# Patient Record
Sex: Male | Born: 1960 | Race: White | Hispanic: No | Marital: Married | Smoking: Never smoker
Health system: Southern US, Community
[De-identification: ages and names within clinical notes are randomized; demographics above are authoritative.]

## PROBLEM LIST (undated history)

## (undated) DIAGNOSIS — R7989 Other specified abnormal findings of blood chemistry: Secondary | ICD-10-CM

## (undated) DIAGNOSIS — I10 Essential (primary) hypertension: Secondary | ICD-10-CM

## (undated) HISTORY — DX: Other specified abnormal findings of blood chemistry: R79.89

## (undated) HISTORY — DX: Essential (primary) hypertension: I10

---

## 2007-05-06 ENCOUNTER — Emergency Department: Payer: Self-pay | Admitting: Emergency Medicine

## 2014-10-03 ENCOUNTER — Ambulatory Visit: Payer: Self-pay | Admitting: Gastroenterology

## 2016-12-05 DIAGNOSIS — M25519 Pain in unspecified shoulder: Secondary | ICD-10-CM | POA: Insufficient documentation

## 2018-10-05 ENCOUNTER — Inpatient Hospital Stay: Payer: Self-pay | Admitting: Oncology

## 2018-10-14 ENCOUNTER — Inpatient Hospital Stay: Payer: Medicare Other

## 2018-10-14 ENCOUNTER — Encounter (INDEPENDENT_AMBULATORY_CARE_PROVIDER_SITE_OTHER): Payer: Self-pay

## 2018-10-14 ENCOUNTER — Encounter: Payer: Self-pay | Admitting: Oncology

## 2018-10-14 ENCOUNTER — Other Ambulatory Visit: Payer: Self-pay

## 2018-10-14 ENCOUNTER — Inpatient Hospital Stay: Payer: Medicare Other | Attending: Oncology | Admitting: Oncology

## 2018-10-14 VITALS — BP 105/71 | HR 77 | Temp 96.7°F | Resp 18 | Ht 71.0 in | Wt 222.3 lb

## 2018-10-14 DIAGNOSIS — R7401 Elevation of levels of liver transaminase levels: Secondary | ICD-10-CM

## 2018-10-14 DIAGNOSIS — Z7982 Long term (current) use of aspirin: Secondary | ICD-10-CM | POA: Insufficient documentation

## 2018-10-14 DIAGNOSIS — D751 Secondary polycythemia: Secondary | ICD-10-CM | POA: Diagnosis present

## 2018-10-14 DIAGNOSIS — I10 Essential (primary) hypertension: Secondary | ICD-10-CM | POA: Insufficient documentation

## 2018-10-14 DIAGNOSIS — Z79899 Other long term (current) drug therapy: Secondary | ICD-10-CM | POA: Diagnosis not present

## 2018-10-14 DIAGNOSIS — R740 Nonspecific elevation of levels of transaminase and lactic acid dehydrogenase [LDH]: Secondary | ICD-10-CM | POA: Diagnosis not present

## 2018-10-14 LAB — CBC WITH DIFFERENTIAL/PLATELET
Abs Immature Granulocytes: 0.03 10*3/uL (ref 0.00–0.07)
Basophils Absolute: 0 10*3/uL (ref 0.0–0.1)
Basophils Relative: 0 %
Eosinophils Absolute: 0 10*3/uL (ref 0.0–0.5)
Eosinophils Relative: 1 %
HCT: 49.8 % (ref 39.0–52.0)
Hemoglobin: 17 g/dL (ref 13.0–17.0)
Immature Granulocytes: 0 %
LYMPHS ABS: 1.7 10*3/uL (ref 0.7–4.0)
Lymphocytes Relative: 23 %
MCH: 33.1 pg (ref 26.0–34.0)
MCHC: 34.1 g/dL (ref 30.0–36.0)
MCV: 96.9 fL (ref 80.0–100.0)
Monocytes Absolute: 0.8 10*3/uL (ref 0.1–1.0)
Monocytes Relative: 11 %
NEUTROS ABS: 4.8 10*3/uL (ref 1.7–7.7)
Neutrophils Relative %: 65 %
Platelets: 185 10*3/uL (ref 150–400)
RBC: 5.14 MIL/uL (ref 4.22–5.81)
RDW: 13.2 % (ref 11.5–15.5)
WBC: 7.4 10*3/uL (ref 4.0–10.5)
nRBC: 0 % (ref 0.0–0.2)

## 2018-10-14 LAB — COMPREHENSIVE METABOLIC PANEL
ALT: 104 U/L — ABNORMAL HIGH (ref 0–44)
AST: 64 U/L — ABNORMAL HIGH (ref 15–41)
Albumin: 4 g/dL (ref 3.5–5.0)
Alkaline Phosphatase: 73 U/L (ref 38–126)
Anion gap: 8 (ref 5–15)
BUN: 30 mg/dL — ABNORMAL HIGH (ref 6–20)
CO2: 22 mmol/L (ref 22–32)
Calcium: 9.3 mg/dL (ref 8.9–10.3)
Chloride: 104 mmol/L (ref 98–111)
Creatinine, Ser: 1.44 mg/dL — ABNORMAL HIGH (ref 0.61–1.24)
GFR calc Af Amer: >60 mL/min (ref >60)
GFR calc non Af Amer: 54 mL/min — ABNORMAL LOW (ref >60)
Glucose, Bld: 99 mg/dL (ref 70–99)
Potassium: 4.1 mmol/L (ref 3.5–5.1)
Sodium: 134 mmol/L — ABNORMAL LOW (ref 135–145)
Total Bilirubin: 0.8 mg/dL (ref 0.3–1.2)
Total Protein: 7.8 g/dL (ref 6.5–8.1)

## 2018-10-14 NOTE — Progress Notes (Signed)
Patient here for initial visit. °

## 2018-10-14 NOTE — Progress Notes (Signed)
Hematology/Oncology Consult note [facility] Telephone:([phone] [phone]   Patient Care Team: Jaclyn Shaggyate, Denny C, MD as PCP - General (Internal Medicine)  REFERRING PROVIDER: Dr.Lateef CHIEF COMPLAINTS/REASON FOR VISIT:  Evaluation of polycytosis  HISTORY OF PRESENTING ILLNESS:  Matthew Owen is a  57 y.o.  male with PMH listed below who was referred to me for evaluation of polycytosis/erythrocytosis Reviewed patient's recent lab work which was obtain by Dr.lateef. He was seen by Dr.lateef for abnormal renal function which was suspected secondary to high muscle mass.  Patient has history of hypogonadism, on testosterone. Suppose to self administrate 100mg  every 10 days, however patient increased his dose to 200mg  every 4 days.  Lab work up showed hemoglobin of 18.3. Was referred to us for need of phlembotomy.  No aggravating or alleviating factors.   Associated signs or symptoms: Denies weight loss, fever, chills, fatigue, night sweats.   Context:  Smoking history: Never smoker Testosterone supplements: Yes.  See above. History of blood clots: Denies Daytime somnolence: Denies Family history of polycythemia: Denies    ROS  MEDICAL HISTORY:  Past Medical History:  Diagnosis Date  . Hypertension   . Low testosterone     SURGICAL HISTORY: History reviewed. No pertinent surgical history.  SOCIAL HISTORY: Social History   Socioeconomic History  . Marital status: Married    Spouse name: Not on file  . Number of children: Not on file  . Years of education: Not on file  . Highest education level: Not on file  Occupational History  . Not on file  Social Needs  . Financial resource strain: Not on file  . Food insecurity:    Worry: Not on file    Inability: Not on file  . Transportation needs:    Medical: Not on file    Non-medical: Not on file  Tobacco Use  . Smoking status: Never Smoker  . Smokeless tobacco: Never Used  Substance  and Sexual Activity  . Alcohol use: Never    Frequency: Never  . Drug use: Never  . Sexual activity: Not on file  Lifestyle  . Physical activity:    Days per week: Not on file    Minutes per session: Not on file  . Stress: Not on file  Relationships  . Social connections:    Talks on phone: Not on file    Gets together: Not on file    Attends religious service: Not on file    Active member of club or organization: Not on file    Attends meetings of clubs or organizations: Not on file    Relationship status: Not on file  . Intimate partner violence:    Fear of current or ex partner: Not on file    Emotionally abused: Not on file    Physically abused: Not on file    Forced sexual activity: Not on file  Other Topics Concern  . Not on file  Social History Narrative  . Not on file    FAMILY HISTORY: Family History  Problem Relation Age of Onset  . Diabetes Mother   . Congestive Heart Failure Mother   . Lung cancer Father   . Heart disease Father     ALLERGIES:  has No Known Allergies.  MEDICATIONS:  Current Outpatient Medications  Medication Sig Dispense Refill  . amLODipine (NORVASC) 10 MG tablet     . aspirin 81 MG chewable tablet Chew by mouth.    . carvedilol (COREG) 12.5 MG tablet  Take by mouth.    . Coenzyme Q10 (CO Q-10) 200 MG CAPS Take by mouth.    . losartan (COZAAR) 100 MG tablet     . Multiple Vitamin (MULTI-VITAMINS) TABS Take by mouth.    . niacin 500 MG tablet Take 1,500 mg by mouth.     . Omega-3 Fatty Acids (FISH OIL) 1000 MG CAPS Take by mouth.    . testosterone cypionate (DEPOTESTOSTERONE CYPIONATE) 200 MG/ML injection     . valACYclovir (VALTREX) 500 MG tablet      No current facility-administered medications for this visit.      PHYSICAL EXAMINATION: ECOG PERFORMANCE STATUS: 0 - Asymptomatic Vitals:   10/14/18 1506  BP: 105/71  Pulse: 77  Resp: 18  Temp: (!) 96.7 F (35.9 C)   Filed Weights   10/14/18 1506  Weight: 222 lb 4.8 oz  (100.8 kg)    Physical Exam  Constitutional: He is oriented to person, place, and time. No distress.  HENT:  Head: Normocephalic and atraumatic.  Mouth/Throat: Oropharynx is clear and moist.  Eyes: Pupils are equal, round, and reactive to light. EOM are normal. No scleral icterus.  Neck: Normal range of motion. Neck supple.  Cardiovascular: Normal rate, regular rhythm and normal heart sounds.  Pulmonary/Chest: Effort normal. No respiratory distress. He has no wheezes.  Abdominal: Soft. Bowel sounds are normal. He exhibits no distension and no mass. There is no tenderness.  Musculoskeletal: Normal range of motion. He exhibits no edema or deformity.  Neurological: He is alert and oriented to person, place, and time. No cranial nerve deficit. Coordination normal.  Skin: Skin is warm and dry. No rash noted. No erythema.  Psychiatric: He has a normal mood and affect. His behavior is normal. Thought content normal.     LABORATORY DATA:  I have reviewed the data as listed Lab Results  Component Value Date   WBC 7.4 10/14/2018   HGB 17.0 10/14/2018   HCT 49.8 10/14/2018   MCV 96.9 10/14/2018   PLT 185 10/14/2018   Recent Labs    10/14/18 1542  NA 134*  K 4.1  CL 104  CO2 22  GLUCOSE 99  BUN 30*  CREATININE 1.44*  CALCIUM 9.3  GFRNONAA 54*  GFRAA >60  PROT 7.8  ALBUMIN 4.0  AST 64*  ALT 104*  ALKPHOS 73  BILITOT 0.8   Iron/TIBC/Ferritin/ %Sat No results found for: IRON, TIBC, FERRITIN, IRONPCTSAT      ASSESSMENT & PLAN:  1. Erythrocytosis   2. Transaminitis     Labs are reviewed and discussed with patient. Polycythemia (erythrocytosis) is an abnormal elevation of hemoglobin (Hgb) and/or hematocrit (Hct) in peripheral blood, and most likely caused by testosterone in his case.  We discussed about potential side effects of inappropriate use of testosterone. Patient states he has realized that self increasing testosterone dose may harm him, and he has cut back his  testosterone dose and follows instructions. I recommend to repeat CBC and obtain CMP. Labs reviewed, hemoglobin normalized after he stopped overuse of testosterone. Recommend patient to follow-up with primary care physician and have CBC checked periodically while he is on testosterone for monitoring hemoglobin. If hemoglobin trends up, will consider phlebotomy.  Hold additional erythrocytosis work-up for now.  Transaminitis, unknown baseline.  suggests patient to follow up with primary care physician for further work up.   Orders Placed This Encounter  Procedures  . CBC with Differential/Platelet    Standing Status:   Future    Number  of Occurrences:   1    Standing Expiration Date:   10/15/2019  . Comprehensive metabolic panel    Standing Status:   Future    Number of Occurrences:   1    Standing Expiration Date:   10/15/2019    We spent sufficient time to discuss many aspect of care, questions were answered to patient's satisfaction.   Return of visit: As needed Thank you for this kind referral and the opportunity to participate in the care of this patient. A copy of today's note is routed to referring provider  Total face to face encounter time for this patient visit was 45 min. >50% of the time was  spent in counseling and coordination of care.    Rickard Patience, MD, PhD Hematology Oncology [facility] Pager- [phone] 10/14/2018

## 2018-11-25 ENCOUNTER — Ambulatory Visit: Payer: Self-pay | Admitting: Urology

## 2019-08-13 DIAGNOSIS — I10 Essential (primary) hypertension: Secondary | ICD-10-CM | POA: Insufficient documentation

## 2019-08-13 DIAGNOSIS — R7989 Other specified abnormal findings of blood chemistry: Secondary | ICD-10-CM | POA: Insufficient documentation

## 2019-08-13 DIAGNOSIS — N289 Disorder of kidney and ureter, unspecified: Secondary | ICD-10-CM | POA: Insufficient documentation

## 2019-11-03 ENCOUNTER — Ambulatory Visit: Payer: Medicare Other | Attending: Internal Medicine

## 2019-11-03 DIAGNOSIS — Z20822 Contact with and (suspected) exposure to covid-19: Secondary | ICD-10-CM

## 2019-11-05 LAB — NOVEL CORONAVIRUS, NAA: SARS-CoV-2, NAA: NOT DETECTED

## 2021-01-02 ENCOUNTER — Other Ambulatory Visit: Payer: Self-pay | Admitting: Student in an Organized Health Care Education/Training Program

## 2021-01-02 ENCOUNTER — Ambulatory Visit
Admission: RE | Admit: 2021-01-02 | Discharge: 2021-01-02 | Disposition: A | Discharge status: Home / Self Care | Payer: Medicare Other | Source: Ambulatory Visit | Attending: Student in an Organized Health Care Education/Training Program | Admitting: Student in an Organized Health Care Education/Training Program

## 2021-01-02 ENCOUNTER — Other Ambulatory Visit: Payer: Self-pay

## 2021-01-02 DIAGNOSIS — R109 Unspecified abdominal pain: Secondary | ICD-10-CM

## 2021-01-02 DIAGNOSIS — R10A0 Flank pain, unspecified side: Secondary | ICD-10-CM

## 2021-01-23 ENCOUNTER — Inpatient Hospital Stay: Payer: Medicare Other

## 2021-01-23 ENCOUNTER — Inpatient Hospital Stay: Payer: Medicare Other | Admitting: Oncology

## 2021-01-24 ENCOUNTER — Encounter: Payer: Self-pay | Admitting: Oncology

## 2021-01-24 ENCOUNTER — Inpatient Hospital Stay: Payer: Medicare Other

## 2021-01-24 ENCOUNTER — Inpatient Hospital Stay: Payer: Medicare Other | Attending: Oncology | Admitting: Oncology

## 2021-01-24 VITALS — BP 125/85 | HR 65 | Temp 96.7°F | Resp 18 | Wt 218.4 lb

## 2021-01-24 DIAGNOSIS — Z801 Family history of malignant neoplasm of trachea, bronchus and lung: Secondary | ICD-10-CM | POA: Diagnosis not present

## 2021-01-24 DIAGNOSIS — Z7982 Long term (current) use of aspirin: Secondary | ICD-10-CM | POA: Diagnosis not present

## 2021-01-24 DIAGNOSIS — E291 Testicular hypofunction: Secondary | ICD-10-CM | POA: Insufficient documentation

## 2021-01-24 DIAGNOSIS — I10 Essential (primary) hypertension: Secondary | ICD-10-CM | POA: Insufficient documentation

## 2021-01-24 DIAGNOSIS — D751 Secondary polycythemia: Secondary | ICD-10-CM | POA: Diagnosis not present

## 2021-01-24 DIAGNOSIS — Z79899 Other long term (current) drug therapy: Secondary | ICD-10-CM | POA: Diagnosis not present

## 2021-01-24 DIAGNOSIS — Z79890 Hormone replacement therapy (postmenopausal): Secondary | ICD-10-CM | POA: Diagnosis not present

## 2021-01-24 HISTORY — DX: Secondary polycythemia: D75.1

## 2021-01-24 LAB — CBC WITH DIFFERENTIAL/PLATELET
Abs Immature Granulocytes: 0.01 10*3/uL (ref 0.00–0.07)
Basophils Absolute: 0 10*3/uL (ref 0.0–0.1)
Basophils Relative: 1 %
Eosinophils Absolute: 0.1 10*3/uL (ref 0.0–0.5)
Eosinophils Relative: 1 %
HCT: 51 % (ref 39.0–52.0)
Hemoglobin: 17.5 g/dL — ABNORMAL HIGH (ref 13.0–17.0)
Immature Granulocytes: 0 %
Lymphocytes Relative: 20 %
Lymphs Abs: 1.4 10*3/uL (ref 0.7–4.0)
MCH: 33.1 pg (ref 26.0–34.0)
MCHC: 34.3 g/dL (ref 30.0–36.0)
MCV: 96.4 fL (ref 80.0–100.0)
Monocytes Absolute: 0.7 10*3/uL (ref 0.1–1.0)
Monocytes Relative: 11 %
Neutro Abs: 4.5 10*3/uL (ref 1.7–7.7)
Neutrophils Relative %: 67 %
Platelets: 192 10*3/uL (ref 150–400)
RBC: 5.29 MIL/uL (ref 4.22–5.81)
RDW: 13.2 % (ref 11.5–15.5)
WBC: 6.7 10*3/uL (ref 4.0–10.5)
nRBC: 0 % (ref 0.0–0.2)

## 2021-01-24 LAB — TECHNOLOGIST SMEAR REVIEW
Plt Morphology: NORMAL
RBC Morphology: NORMAL
WBC Morphology: NORMAL

## 2021-01-24 NOTE — Progress Notes (Signed)
Hematology/Oncology Consult note [facility] [phone] [phone]   Patient Care Team: Jaclyn Shaggy, MD as PCP - General (Internal Medicine)  REFERRING PROVIDER: Dr.Lateef CHIEF COMPLAINTS/REASON FOR VISIT:  Follow-up for erythrocytosis  HISTORY OF PRESENTING ILLNESS:  Matthew Owen is a  60 y.o.  male with PMH listed below who was referred to me for evaluation of polycytosis/erythrocytosis Reviewed patient's recent lab work which was obtain by Dr.lateef. He was seen by Dr.lateef for abnormal renal function which was suspected secondary to high muscle mass.  Patient has history of hypogonadism, on testosterone. Suppose to self administrate 100mg  every 10 days, however patient increased his dose to 200mg  every 4 days.  Lab work up showed hemoglobin of 18.3. Was referred to for need of phlembotomy.  No aggravating or alleviating factors.   Associated signs or symptoms: Denies weight loss, fever, chills, fatigue, night sweats.   Context:  Smoking history: Never smoker Testosterone supplements: Yes.  See above. History of blood clots: Denies Daytime somnolence: Denies Family history of polycythemia: Denies  INTERVAL HISTORY Matthew Owen is a 60 y.o. male who has above history reviewed by me today presents for follow up visit for management of erythrocytosis Problems and complaints are listed below: Patient was seen previously by me in 2019.  At that time, he increased the dose of testosterone and had erythrocytosis which was felt to be secondary to testosterone use. Patient was advised to reduce to his previous level of testosterone dosage. Recently patient was seen by Dr. 41 as well as primary care provider and had blood work done. 12/25/2020, hemoglobin 17.9, hematocrit 52.6. 12/25/2020, hemoglobin 17.5, hematocrit 50.9 Patient was referred back to establish care with me for management. Patient was accompanied by his daughter  today. Patient denies any shortness of breath, chest pain, unintentional weight loss, cough, etc.  Patient continues to be on testosterone replacement therapy.  He reports using the dose that  suggested by prescriber.  Review of Systems  Constitutional: Negative for chills, fever, malaise/fatigue and weight loss.  HENT: Negative for sore throat.   Eyes: Negative for redness.  Respiratory: Negative for cough, shortness of breath and wheezing.   Cardiovascular: Negative for chest pain, palpitations and leg swelling.  Gastrointestinal: Negative for abdominal pain, blood in stool, nausea and vomiting.  Genitourinary: Negative for dysuria.  Musculoskeletal: Negative for myalgias.  Skin: Negative for rash.  Neurological: Negative for dizziness, tingling and tremors.  Endo/Heme/Allergies: Does not bruise/bleed easily.  Psychiatric/Behavioral: Negative for hallucinations.    MEDICAL HISTORY:  Past Medical History:  Diagnosis Date  . Hypertension   . Low testosterone     SURGICAL HISTORY: History reviewed. No pertinent surgical history.  SOCIAL HISTORY: Social History   Socioeconomic History  . Marital status: Married    Spouse name: Not on file  . Number of children: Not on file  . Years of education: Not on file  . Highest education level: Not on file  Occupational History  . Not on file  Tobacco Use  . Smoking status: Never Smoker  . Smokeless tobacco: Never Used  Vaping Use  . Vaping Use: Never used  Substance and Sexual Activity  . Alcohol use: Never  . Drug use: Never  . Sexual activity: Not on file  Other Topics Concern  . Not on file  Social History Narrative  . Not on file   Social Determinants of Health   Financial Resource Strain: Not on file  Food Insecurity: Not on file  Transportation Needs: Not on file  Physical Activity: Not on file  Stress: Not on file  Social Connections: Not on file  Intimate Partner Violence: Not on file    FAMILY  HISTORY: Family History  Problem Relation Age of Onset  . Diabetes Mother   . Congestive Heart Failure Mother   . Lung cancer Father   . Heart disease Father     ALLERGIES:  has No Known Allergies.  MEDICATIONS:  Current Outpatient Medications  Medication Sig Dispense Refill  . amLODipine (NORVASC) 10 MG tablet     . aspirin 81 MG chewable tablet Chew by mouth.    . carvedilol (COREG) 12.5 MG tablet Take by mouth.    . Coenzyme Q10 (CO Q-10) 200 MG CAPS Take by mouth.    Marland Kitchen LORazepam (ATIVAN) 2 MG tablet Take 2 mg by mouth daily.    Marland Kitchen losartan (COZAAR) 100 MG tablet     . Multiple Vitamin (MULTI-VITAMINS) TABS Take by mouth.    . niacin 500 MG tablet Take 1,500 mg by mouth.     . Omega-3 Fatty Acids (FISH OIL) 1000 MG CAPS Take by mouth.    . rosuvastatin (CRESTOR) 10 MG tablet     . tamsulosin (FLOMAX) 0.4 MG CAPS capsule Take by mouth.    . testosterone cypionate (DEPOTESTOSTERONE CYPIONATE) 200 MG/ML injection     . valACYclovir (VALTREX) 500 MG tablet      No current facility-administered medications for this visit.     PHYSICAL EXAMINATION: ECOG PERFORMANCE STATUS: 0 - Asymptomatic Vitals:   01/24/21 1013  BP: 125/85  Pulse: 65  Resp: 18  Temp: (!) 96.7 F (35.9 C)   Filed Weights   01/24/21 1013  Weight: 218 lb 6.4 oz (99.1 kg)    Physical Exam Constitutional:      General: He is not in acute distress. HENT:     Head: Normocephalic and atraumatic.  Eyes:     General: No scleral icterus.    Pupils: Pupils are equal, round, and reactive to light.  Cardiovascular:     Rate and Rhythm: Normal rate and regular rhythm.     Heart sounds: Normal heart sounds.  Pulmonary:     Effort: Pulmonary effort is normal. No respiratory distress.     Breath sounds: No wheezing.  Abdominal:     General: Bowel sounds are normal. There is no distension.     Palpations: Abdomen is soft. There is no mass.     Tenderness: There is no abdominal tenderness.  Musculoskeletal:         General: No deformity. Normal range of motion.     Cervical back: Normal range of motion and neck supple.  Skin:    General: Skin is warm and dry.     Findings: No erythema or rash.  Neurological:     Mental Status: He is alert and oriented to person, place, and time.     Cranial Nerves: No cranial nerve deficit.     Coordination: Coordination normal.  Psychiatric:        Behavior: Behavior normal.        Thought Content: Thought content normal.      LABORATORY DATA:  I have reviewed the data as listed Lab Results  Component Value Date   WBC 6.7 01/24/2021   HGB 17.5 (H) 01/24/2021   HCT 51.0 01/24/2021   MCV 96.4 01/24/2021   PLT 192 01/24/2021   No results for input(s): NA, K, CL, CO2,  GLUCOSE, BUN, CREATININE, CALCIUM, GFRNONAA, GFRAA, PROT, ALBUMIN, AST, ALT, ALKPHOS, BILITOT, BILIDIR, IBILI in the last 8760 hours. Iron/TIBC/Ferritin/ %Sat No results found for: IRON, TIBC, FERRITIN, IRONPCTSAT      ASSESSMENT & PLAN:  1. Erythrocytosis   2. 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 Rule out other etiology, myeloproliferative disease, sleep apnea, etc. Check CBC, smear, erythropoietin level, carbon monoxide level, JAK2 V617F mutation negative, with reflex to other mutations CALR, MPL, JAK 2 Ex 12-15 mutations.  BCR ABL. For now, recommend to keep hematocrit less than 50. Her current hematocrit is at 51, recommend phlebotomy 500 cc x 1.  Follow-up plan to be determined depending on above results. Orders Placed This Encounter  Procedures  . CBC with Differential/Platelet    Standing Status:   Future    Number of Occurrences:   1    Standing Expiration Date:   01/24/2022  . Technologist smear review    Standing Status:   Future    Number of Occurrences:   1    Standing Expiration Date:   01/24/2022  . JAK2 V617F, w Reflex to CALR/E12/MPL    Standing Status:   Future    Number of  Occurrences:   1    Standing Expiration Date:   01/24/2022  . BCR-ABL1 FISH    Standing Status:   Future    Number of Occurrences:   1    Standing Expiration Date:   01/24/2022  . Erythropoietin    Standing Status:   Future    Number of Occurrences:   1    Standing Expiration Date:   01/24/2022  . Carbon monoxide, blood (performed at ref lab)    Standing Status:   Future    Number of Occurrences:   1    Standing Expiration Date:   01/24/2022    We spent sufficient time to discuss many aspect of care, questions were answered to patient's satisfaction.   Rickard Patience, MD, PhD Hematology Oncology [facility] Pager- [phone] 01/24/2021

## 2021-01-24 NOTE — Addendum Note (Signed)
Addended by: Rickard Patience on: 01/24/2021 07:23 PM   Modules accepted: Orders

## 2021-01-24 NOTE — Progress Notes (Signed)
Patient last seen by Dr. Cathie Hoops in 10/2018 and has been referred back by Dr. Cherylann Ratel.

## 2021-01-25 ENCOUNTER — Telehealth: Payer: Self-pay

## 2021-01-25 LAB — CARBON MONOXIDE, BLOOD (PERFORMED AT REF LAB): Carbon Monoxide, Blood: 1.3 % (ref 0.0–3.6)

## 2021-01-25 LAB — ERYTHROPOIETIN: Erythropoietin: 9.7 m[IU]/mL (ref 2.6–18.5)

## 2021-01-25 NOTE — Telephone Encounter (Signed)
Patient notified via MyChart.  Please contact patient to schedule as MD recommends.  MD f/u TBD at this time pending other lab results.

## 2021-01-25 NOTE — Telephone Encounter (Signed)
-----   Message from Rickard Patience, MD sent at 01/24/2021  7:17 PM EDT ----- Please let him know that Hct is 51, recommend him to proceed with phlebotomy 500cc x 1.  Future plan TBD pending his results. thanks

## 2021-01-29 LAB — BCR-ABL1 FISH
Cells Analyzed: 200
Cells Counted: 200

## 2021-01-31 ENCOUNTER — Inpatient Hospital Stay: Payer: Medicare Other

## 2021-01-31 ENCOUNTER — Other Ambulatory Visit: Payer: Self-pay

## 2021-01-31 VITALS — BP 129/95 | HR 76 | Temp 97.4°F | Resp 18

## 2021-01-31 DIAGNOSIS — D751 Secondary polycythemia: Secondary | ICD-10-CM

## 2021-01-31 NOTE — Progress Notes (Signed)
Patient received his First therapeutic phlebotomy today. Removed 500 ml without any difficulties. Pt tolerated well. Declined any snacks post treatment. Observed. VSS. Pt feels at his baseline and ready for discharge.

## 2021-01-31 NOTE — Patient Instructions (Signed)

## 2021-02-01 ENCOUNTER — Other Ambulatory Visit: Payer: Self-pay | Admitting: Nephrology

## 2021-02-01 DIAGNOSIS — I10 Essential (primary) hypertension: Secondary | ICD-10-CM

## 2021-02-02 LAB — JAK2 V617F, W REFLEX TO CALR/E12/MPL

## 2021-02-02 LAB — CALR + JAK2 E12-15 + MPL (REFLEXED)

## 2021-02-05 ENCOUNTER — Telehealth: Payer: Self-pay

## 2021-02-05 NOTE — Telephone Encounter (Signed)
Please schedule patient as requested by MD and notify pt.

## 2021-02-05 NOTE — Telephone Encounter (Signed)
-----   Message from Rickard Patience, MD sent at 02/03/2021 12:09 PM EDT ----- Please schedule virtual MD to discuss results during week of 4/4

## 2021-02-05 NOTE — Telephone Encounter (Signed)
Done..  Pt has been sched as requested. MyChart visit sched on 02/14/21 per pt request

## 2021-02-14 ENCOUNTER — Encounter: Payer: Self-pay | Admitting: Oncology

## 2021-02-14 ENCOUNTER — Inpatient Hospital Stay: Payer: Medicare Other | Attending: Oncology | Admitting: Oncology

## 2021-02-14 DIAGNOSIS — D751 Secondary polycythemia: Secondary | ICD-10-CM | POA: Diagnosis not present

## 2021-02-14 DIAGNOSIS — Z79890 Hormone replacement therapy (postmenopausal): Secondary | ICD-10-CM

## 2021-02-14 NOTE — Progress Notes (Signed)
Patient denies new problems/concerns today.   °

## 2021-02-14 NOTE — Progress Notes (Signed)
HEMATOLOGY-ONCOLOGY TeleHEALTH VISIT PROGRESS NOTE  I connected with Matthew Owen on 02/14/21  at  2:15 PM EDT by video enabled telemedicine visit and verified that I am speaking with the correct person using two identifiers. I discussed the limitations, risks, security and privacy concerns of performing an evaluation and management service by telemedicine and the availability of in-person appointments. The patient expressed understanding and agreed to proceed.   Other persons participating in the visit and their role in the encounter:  None  Patient's location: Home  Provider's location: office Chief Complaint: Erythrocytosis   INTERVAL HISTORY Can Matthew Owen is a 60 y.o. male who has above history reviewed by me today presents for follow up visit for management of secondary erythrocytosis Problems and complaints are listed below:  Patient underwent phlebotomy during interval.  He tolerated it very well. He presents virtually to discuss about lab results and management plan.  No new complaints.  Review of Systems  Constitutional: Positive for fatigue. Negative for appetite change, chills, fever and unexpected weight change.  HENT:   Negative for hearing loss and voice change.   Eyes: Negative for eye problems and icterus.  Respiratory: Negative for chest tightness, cough and shortness of breath.   Cardiovascular: Negative for chest pain and leg swelling.  Gastrointestinal: Negative for abdominal distention and abdominal pain.  Endocrine: Negative for hot flashes.  Genitourinary: Negative for difficulty urinating, dysuria and frequency.   Musculoskeletal: Negative for arthralgias.  Skin: Negative for itching and rash.  Neurological: Negative for light-headedness and numbness.  Hematological: Negative for adenopathy. Does not bruise/bleed easily.  Psychiatric/Behavioral: Negative for confusion.    Past Medical History:  Diagnosis Date  . Erythrocytosis 01/24/2021  . Hypertension   .  Low testosterone    History reviewed. No pertinent surgical history.  Family History  Problem Relation Age of Onset  . Diabetes Mother   . Congestive Heart Failure Mother   . Lung cancer Father   . Heart disease Father     Social History   Socioeconomic History  . Marital status: Married    Spouse name: Not on file  . Number of children: Not on file  . Years of education: Not on file  . Highest education level: Not on file  Occupational History  . Not on file  Tobacco Use  . Smoking status: Never Smoker  . Smokeless tobacco: Never Used  Vaping Use  . Vaping Use: Never used  Substance and Sexual Activity  . Alcohol use: Never  . Drug use: Never  . Sexual activity: Not on file  Other Topics Concern  . Not on file  Social History Narrative  . Not on file   Social Determinants of Health   Financial Resource Strain: Not on file  Food Insecurity: Not on file  Transportation Needs: Not on file  Physical Activity: Not on file  Stress: Not on file  Social Connections: Not on file  Intimate Partner Violence: Not on file    Current Outpatient Medications on File Prior to Visit  Medication Sig Dispense Refill  . amLODipine (NORVASC) 10 MG tablet     . aspirin 81 MG chewable tablet Chew by mouth.    . carvedilol (COREG) 12.5 MG tablet Take by mouth.    . Coenzyme Q10 (CO Q-10) 200 MG CAPS Take by mouth.    Marland Kitchen LORazepam (ATIVAN) 2 MG tablet Take 2 mg by mouth every 8 (eight) hours as needed.    Marland Kitchen losartan (COZAAR) 100 MG tablet     .  Multiple Vitamin (MULTI-VITAMINS) TABS Take by mouth.    . niacin 500 MG tablet Take 1,500 mg by mouth.     . Omega-3 Fatty Acids (FISH OIL) 1000 MG CAPS Take by mouth.    . rosuvastatin (CRESTOR) 10 MG tablet     . testosterone cypionate (DEPOTESTOSTERONE CYPIONATE) 200 MG/ML injection     . TURMERIC PO Take 1,000 mg by mouth daily.    . valACYclovir (VALTREX) 500 MG tablet as needed.     No current facility-administered medications on file  prior to visit.    No Known Allergies     Observations/Objective: Today's Vitals   02/14/21 1422  PainSc: 0-No pain   There is no height or weight on file to calculate BMI.  Physical Exam Neurological:     Mental Status: He is alert.     CBC    Component Value Date/Time   WBC 6.7 01/24/2021 1058   RBC 5.29 01/24/2021 1058   HGB 17.5 (H) 01/24/2021 1058   HCT 51.0 01/24/2021 1058   PLT 192 01/24/2021 1058   MCV 96.4 01/24/2021 1058   MCH 33.1 01/24/2021 1058   MCHC 34.3 01/24/2021 1058   RDW 13.2 01/24/2021 1058   LYMPHSABS 1.4 01/24/2021 1058   MONOABS 0.7 01/24/2021 1058   EOSABS 0.1 01/24/2021 1058   BASOSABS 0.0 01/24/2021 1058    CMP     Component Value Date/Time   NA 134 (L) 10/14/2018 1542   K 4.1 10/14/2018 1542   CL 104 10/14/2018 1542   CO2 22 10/14/2018 1542   GLUCOSE 99 10/14/2018 1542   BUN 30 (H) 10/14/2018 1542   CREATININE 1.44 (H) 10/14/2018 1542   CALCIUM 9.3 10/14/2018 1542   PROT 7.8 10/14/2018 1542   ALBUMIN 4.0 10/14/2018 1542   AST 64 (H) 10/14/2018 1542   ALT 104 (H) 10/14/2018 1542   ALKPHOS 73 10/14/2018 1542   BILITOT 0.8 10/14/2018 1542   GFRNONAA 54 (L) 10/14/2018 1542   GFRAA >60 10/14/2018 1542     Assessment and Plan: 1. Erythrocytosis   2. Long-term current use of testosterone replacement therapy     Labs reviewed and discussed with patient. JAK2 V617F mutation negative, with reflex to other mutations CALR, MPL, JAK 2 Ex 12-15 mutations negative. BCR ABL 1 FISH negative. Normal erythropoietin. Discussed with patient that erythrocytosis is likely secondary to testosterone use. Continue phlebotomy if hematocrit above 50.  He agrees with plan.  Follow Up Instructions: H&H in 2 weeks, 6 weeks +/- phlebotomy. Follow-up in 3 months with CBC, MD, +/- phlebotomy.   I discussed the assessment and treatment plan with the patient. The patient was provided an opportunity to ask questions and all were answered. The patient  agreed with the plan and demonstrated an understanding of the instructions.  The patient was advised to call back or seek an in-person evaluation if the symptoms worsen or if the condition fails to improve as anticipated.    Matthew Patience, MD 02/14/2021 8:45 PM

## 2021-02-21 ENCOUNTER — Ambulatory Visit
Admission: RE | Admit: 2021-02-21 | Discharge: 2021-02-21 | Disposition: A | Discharge status: Home / Self Care | Payer: Medicare Other | Source: Ambulatory Visit | Attending: Nephrology | Admitting: Nephrology

## 2021-02-21 DIAGNOSIS — I10 Essential (primary) hypertension: Secondary | ICD-10-CM

## 2021-02-23 ENCOUNTER — Ambulatory Visit (INDEPENDENT_AMBULATORY_CARE_PROVIDER_SITE_OTHER): Payer: Medicare Other | Admitting: Cardiology

## 2021-02-23 ENCOUNTER — Encounter: Payer: Self-pay | Admitting: Cardiology

## 2021-02-23 ENCOUNTER — Other Ambulatory Visit: Payer: Self-pay

## 2021-02-23 VITALS — BP 110/70 | HR 75 | Ht 71.0 in | Wt 220.0 lb

## 2021-02-23 DIAGNOSIS — I2510 Atherosclerotic heart disease of native coronary artery without angina pectoris: Secondary | ICD-10-CM | POA: Diagnosis not present

## 2021-02-23 DIAGNOSIS — I10 Essential (primary) hypertension: Secondary | ICD-10-CM

## 2021-02-23 DIAGNOSIS — E7800 Pure hypercholesterolemia, unspecified: Secondary | ICD-10-CM | POA: Diagnosis not present

## 2021-02-23 MED ORDER — ROSUVASTATIN CALCIUM 20 MG PO TABS: 20.0000 mg | ORAL_TABLET | Freq: Every day | ORAL | 3 refills | Status: DC

## 2021-02-23 NOTE — Progress Notes (Signed)
Cardiology Office Note:    Date:  02/23/2021   ID:  Matthew Owen, DOB 1961/08/26, MRN [id number]  PCP:  Jaclyn Shaggy, MD   [facility]  Cardiologist:  No primary care provider on file.  Advanced Practice Provider:  No care team member to display Electrophysiologist:  None       Referring MD: Jaclyn Shaggy, MD   Chief Complaint  Patient presents with  . New Patient (Initial Visit)    Referred by Kidney Dr fopr LAD Calcium scre [phone]. Meds reviewed verbally with patient.     History of Present Illness:    Matthew Owen is a 60 y.o. male with a hx of hypertension, hyperlipidemia, low testosterone, erythrocytosis who presents due to coronary artery calcification.    Patient had calcium score obtained on 02/21/2021 for risk stratification due to elevated LDL.  Cardiac total score of 583, mostly concentrated in the LAD (577) which is 95th percentile MESA compared controls.  He denies chest pain or shortness of breath.  Exercises 4 times a week without any symptoms.  Family history of MI in his father at age 43.  Currently tolerates Crestor 10 mg daily.  Past Medical History:  Diagnosis Date  . Erythrocytosis 01/24/2021  . Hypertension   . Low testosterone     History reviewed. No pertinent surgical history.  Current Medications: Current Meds  Medication Sig  . amLODipine (NORVASC) 10 MG tablet   . aspirin 81 MG chewable tablet Chew by mouth.  . carvedilol (COREG) 12.5 MG tablet Take by mouth.  . Coenzyme Q10 (CO Q-10) 200 MG CAPS Take by mouth.  Marland Kitchen LORazepam (ATIVAN) 2 MG tablet Take 2 mg by mouth every 8 (eight) hours as needed.  Marland Kitchen losartan (COZAAR) 100 MG tablet   . Multiple Vitamin (MULTI-VITAMINS) TABS Take by mouth.  . niacin 500 MG tablet Take 1,500 mg by mouth.   . Omega-3 Fatty Acids (FISH OIL) 1000 MG CAPS Take by mouth.  . testosterone cypionate (DEPOTESTOSTERONE CYPIONATE) 200 MG/ML injection   . TURMERIC PO Take 1,000 mg by mouth daily.  .  valACYclovir (VALTREX) 500 MG tablet as needed.  . [DISCONTINUED] rosuvastatin (CRESTOR) 10 MG tablet      Allergies:   Patient has no known allergies.   Social History   Socioeconomic History  . Marital status: Married    Spouse name: Not on file  . Number of children: Not on file  . Years of education: Not on file  . Highest education level: Not on file  Occupational History  . Not on file  Tobacco Use  . Smoking status: Never Smoker  . Smokeless tobacco: Never Used  Vaping Use  . Vaping Use: Never used  Substance and Sexual Activity  . Alcohol use: Never  . Drug use: Never  . Sexual activity: Not on file  Other Topics Concern  . Not on file  Social History Narrative  . Not on file   Social Determinants of Health   Financial Resource Strain: Not on file  Food Insecurity: Not on file  Transportation Needs: Not on file  Physical Activity: Not on file  Stress: Not on file  Social Connections: Not on file     Family History: The patient's family history includes Congestive Heart Failure in his mother; Diabetes in his mother; Heart disease in his father; Lung cancer in his father.  ROS:   Please see the history of present illness.     All  other systems reviewed and are negative.  EKGs/Labs/Other Studies Reviewed:    The following studies were reviewed today:  EKG:  EKG is  ordered today.  The ekg ordered today demonstrates normal sinus rhythm  Recent Labs: 01/24/2021: Hemoglobin 17.5; Platelets 192  Recent Lipid Panel No results found for: CHOL, TRIG, HDL, CHOLHDL, VLDL, LDLCALC, LDLDIRECT   Risk Assessment/Calculations:      Physical Exam:    VS:  BP 110/70 (BP Location: Left Arm, Patient Position: Sitting, Cuff Size: Large)   Pulse 75   Ht 5\' 11"  (1.803 m)   Wt 220 lb (99.8 kg)   SpO2 98%   BMI 30.68 kg/m     Wt Readings from Last 3 Encounters:  02/23/21 220 lb (99.8 kg)  01/24/21 218 lb 6.4 oz (99.1 kg)  10/14/18 222 lb 4.8 oz (100.8 kg)      GEN:  Well nourished, well developed in no acute distress HEENT: Normal NECK: No JVD; No carotid bruits LYMPHATICS: No lymphadenopathy CARDIAC: RRR, no murmurs, rubs, gallops RESPIRATORY:  Clear to auscultation without rales, wheezing or rhonchi  ABDOMEN: Soft, non-tender, non-distended MUSCULOSKELETAL:  No edema; No deformity  SKIN: Warm and dry NEUROLOGIC:  Alert and oriented x 3 PSYCHIATRIC:  Normal affect   ASSESSMENT:    1. Coronary artery disease involving native coronary artery of native heart without angina pectoris   2. Primary hypertension   3. Pure hypercholesterolemia    PLAN:    In order of problems listed above:  1. CAD, very high coronary calcium score, 583.  Concentrated in the proximal LAD.  Last LDL 73.  Increase Crestor to moderate intensity 20 mg daily, continue aspirin 81 mg daily.  Obtain echocardiogram to evaluate any cardiac dysfunction.  Will not pursue stress test as patient is asymptomatic.  If patient was to develop symptoms consistent with angina in the future, will proceed directly with left heart cath due to high pretest probability. 2. Hypertension, use of testosterone likely contributing.  Blood pressure control.  Continue amlodipine, losartan, carvedilol. 3. Hyperlipidemia, goal LDL less than 70.  Increase Crestor to 20 mg daily.  Follow-up in 6 months     Medication Adjustments/Labs and Tests Ordered: Current medicines are reviewed at length with the patient today.  Concerns regarding medicines are outlined above.  Orders Placed This Encounter  Procedures  . EKG 12-Lead  . ECHOCARDIOGRAM COMPLETE   Meds ordered this encounter  Medications  . rosuvastatin (CRESTOR) 20 MG tablet    Sig: Take 1 tablet (20 mg total) by mouth daily.    Dispense:  90 tablet    Refill:  3    Patient Instructions  Medication Instructions:   Your physician has recommended you make the following change in your medication:    1.  INCREASE your Crestor to  20 MG once daily.   *If you need a refill on your cardiac medications before your next appointment, please call your pharmacy*   Lab Work: None ordered   Testing/Procedures:  Your physician has requested that you have an echocardiogram. Echocardiography is a painless test that uses sound waves to create images of your heart. It provides your doctor with information about the size and shape of your heart and how well your heart's chambers and valves are working. This procedure takes approximately one hour. There are no restrictions for this procedure.    Follow-Up: At [facility], you and your health needs are our priority.  As part of our continuing mission to  provide you with exceptional heart care, we have created designated Provider Care Teams.  These Care Teams include your primary Cardiologist (physician) and Advanced Practice Providers (APPs -  Physician Assistants and Nurse Practitioners) who all work together to provide you with the care you need, when you need it.  We recommend signing up for the patient portal called "MyChart".  Sign up information is provided on this After Visit Summary.  MyChart is used to connect with patients for Virtual Visits (Telemedicine).  Patients are able to view lab/test results, encounter notes, upcoming appointments, etc.  Non-urgent messages can be sent to your provider as well.   To learn more about what you can do with MyChart, go to [email or web address].    Your next appointment:   6 month(s)  The format for your next appointment:   In Person  Provider:   Debbe Odea, MD   Other Instructions      Signed, Debbe Odea, MD  02/23/2021 5:00 PM    [facility]

## 2021-02-23 NOTE — Patient Instructions (Signed)
Medication Instructions:   Your physician has recommended you make the following change in your medication:    1.  INCREASE your Crestor to 20 MG once daily.   *If you need a refill on your cardiac medications before your next appointment, please call your pharmacy*   Lab Work: None ordered   Testing/Procedures:  Your physician has requested that you have an echocardiogram. Echocardiography is a painless test that uses sound waves to create images of your heart. It provides your doctor with information about the size and shape of your heart and how well your heart's chambers and valves are working. This procedure takes approximately one hour. There are no restrictions for this procedure.    Follow-Up: At [facility], you and your health needs are our priority.  As part of our continuing mission to provide you with exceptional heart care, we have created designated Provider Care Teams.  These Care Teams include your primary Cardiologist (physician) and Advanced Practice Providers (APPs -  Physician Assistants and Nurse Practitioners) who all work together to provide you with the care you need, when you need it.  We recommend signing up for the patient portal called "MyChart".  Sign up information is provided on this After Visit Summary.  MyChart is used to connect with patients for Virtual Visits (Telemedicine).  Patients are able to view lab/test results, encounter notes, upcoming appointments, etc.  Non-urgent messages can be sent to your provider as well.   To learn more about what you can do with MyChart, go to [email or web address].    Your next appointment:   6 month(s)  The format for your next appointment:   In Person  Provider:   Debbe Odea, MD   Other Instructions

## 2021-02-28 ENCOUNTER — Inpatient Hospital Stay: Payer: Medicare Other

## 2021-02-28 ENCOUNTER — Other Ambulatory Visit: Payer: Self-pay

## 2021-02-28 DIAGNOSIS — D751 Secondary polycythemia: Secondary | ICD-10-CM

## 2021-02-28 LAB — CBC WITH DIFFERENTIAL/PLATELET
Abs Immature Granulocytes: 0.01 10*3/uL (ref 0.00–0.07)
Basophils Absolute: 0 10*3/uL (ref 0.0–0.1)
Basophils Relative: 0 %
Eosinophils Absolute: 0.1 10*3/uL (ref 0.0–0.5)
Eosinophils Relative: 1 %
HCT: 46.4 % (ref 39.0–52.0)
Hemoglobin: 16.2 g/dL (ref 13.0–17.0)
Immature Granulocytes: 0 %
Lymphocytes Relative: 23 %
Lymphs Abs: 1.7 10*3/uL (ref 0.7–4.0)
MCH: 34 pg (ref 26.0–34.0)
MCHC: 34.9 g/dL (ref 30.0–36.0)
MCV: 97.3 fL (ref 80.0–100.0)
Monocytes Absolute: 0.9 10*3/uL (ref 0.1–1.0)
Monocytes Relative: 13 %
Neutro Abs: 4.4 10*3/uL (ref 1.7–7.7)
Neutrophils Relative %: 63 %
Platelets: 185 10*3/uL (ref 150–400)
RBC: 4.77 MIL/uL (ref 4.22–5.81)
RDW: 12.9 % (ref 11.5–15.5)
WBC: 7.1 10*3/uL (ref 4.0–10.5)
nRBC: 0 % (ref 0.0–0.2)

## 2021-02-28 NOTE — Progress Notes (Signed)
HCT 46.4. This falls below MD parameters for therapeutic phlebotomy. Pt informed of results.

## 2021-03-01 ENCOUNTER — Other Ambulatory Visit: Payer: BC Managed Care – PPO

## 2021-03-02 ENCOUNTER — Other Ambulatory Visit: Payer: Self-pay

## 2021-03-02 ENCOUNTER — Ambulatory Visit (INDEPENDENT_AMBULATORY_CARE_PROVIDER_SITE_OTHER): Payer: Medicare Other

## 2021-03-02 DIAGNOSIS — I2510 Atherosclerotic heart disease of native coronary artery without angina pectoris: Secondary | ICD-10-CM | POA: Diagnosis not present

## 2021-03-02 LAB — ECHOCARDIOGRAM COMPLETE
Area-P 1/2: 3.42 cm2
S' Lateral: 2.8 cm

## 2021-03-14 ENCOUNTER — Ambulatory Visit: Payer: BC Managed Care – PPO | Admitting: Internal Medicine

## 2021-03-26 ENCOUNTER — Telehealth: Payer: Self-pay | Admitting: *Deleted

## 2021-03-26 NOTE — Telephone Encounter (Signed)
Please cancel lab appt on 03/28/21.  The lab ordered is for H&H.

## 2021-03-26 NOTE — Telephone Encounter (Signed)
Patient was in their office today and they drew CBC on patient and will fax results to our office when available. States patient is scheduled for lab visit Wednesday as well as a possible phlebotomy and should not need the lab work rechecked

## 2021-03-27 ENCOUNTER — Encounter: Payer: Self-pay | Admitting: Nephrology

## 2021-03-27 ENCOUNTER — Telehealth: Payer: Self-pay | Admitting: *Deleted

## 2021-03-27 NOTE — Telephone Encounter (Signed)
Victorino Dike from Dr Garnett [facility] office called stating that she has faxed patient labs over and his Hgb is 16.6, Hct 50.2, Patient is asking her if he needs to keep appointment for phlebotomy or not. Please avise

## 2021-03-27 NOTE — Telephone Encounter (Signed)
Call returned to [facility] and left voice mail that patient does need Phlebotomy and that he has cancelled his appointment for it

## 2021-03-27 NOTE — Telephone Encounter (Signed)
Patient will need phlebotomy.

## 2021-03-27 NOTE — Telephone Encounter (Signed)
Message from yesterday was to cancel lab appt for 5/18 due labs being drawn at Dr. Garnett [facility] office.  Will wait to confirm with Dr. Cathie Hoops if patient will need phlebo on 5/18.

## 2021-03-27 NOTE — Telephone Encounter (Signed)
He had already called to cancel appt for tomorrow.

## 2021-03-27 NOTE — Telephone Encounter (Signed)
Yes given that Hct is above 50

## 2021-03-28 ENCOUNTER — Inpatient Hospital Stay: Payer: BC Managed Care – PPO

## 2021-03-28 ENCOUNTER — Inpatient Hospital Stay: Payer: Medicare Other | Attending: Oncology

## 2021-03-28 ENCOUNTER — Other Ambulatory Visit: Payer: Self-pay

## 2021-03-28 VITALS — BP 134/84 | HR 73 | Resp 18

## 2021-03-28 DIAGNOSIS — D751 Secondary polycythemia: Secondary | ICD-10-CM | POA: Diagnosis not present

## 2021-03-28 DIAGNOSIS — Z79899 Other long term (current) drug therapy: Secondary | ICD-10-CM | POA: Insufficient documentation

## 2021-03-28 NOTE — Progress Notes (Signed)
HCT 50.2 per [facility] lab result drawn on 03/26/21. Removed 500 ml blood for therapeutic phlebotomy as ordered by MD. Pt tolerated procedure well. VSS at time of discharge.

## 2021-03-29 ENCOUNTER — Other Ambulatory Visit: Payer: BC Managed Care – PPO

## 2021-04-24 ENCOUNTER — Other Ambulatory Visit: Payer: BC Managed Care – PPO

## 2021-04-25 ENCOUNTER — Encounter: Payer: Self-pay | Admitting: Oncology

## 2021-04-25 ENCOUNTER — Inpatient Hospital Stay: Payer: Medicare Other

## 2021-04-25 ENCOUNTER — Other Ambulatory Visit: Payer: Self-pay

## 2021-04-25 ENCOUNTER — Inpatient Hospital Stay: Payer: Medicare Other | Attending: Oncology | Admitting: Oncology

## 2021-04-25 VITALS — BP 108/78

## 2021-04-25 DIAGNOSIS — Z79890 Hormone replacement therapy (postmenopausal): Secondary | ICD-10-CM | POA: Diagnosis not present

## 2021-04-25 DIAGNOSIS — D751 Secondary polycythemia: Secondary | ICD-10-CM

## 2021-04-25 LAB — HEMOGLOBIN AND HEMATOCRIT, BLOOD
HCT: 44.7 % (ref 39.0–52.0)
Hemoglobin: 15 g/dL (ref 13.0–17.0)

## 2021-04-25 NOTE — Progress Notes (Signed)
HEMATOLOGY-ONCOLOGY TeleHEALTH VISIT PROGRESS NOTE  I connected with Matthew Owen on 04/25/21  at  2:30 PM EDT by video enabled telemedicine visit and verified that I am speaking with the correct person using two identifiers. I discussed the limitations, risks, security and privacy concerns of performing an evaluation and management service by telemedicine and the availability of in-person appointments. The patient expressed understanding and agreed to proceed.   Other persons participating in the visit and their role in the encounter:  None  Patient's location: Home  Provider's location: office Chief Complaint: Erythrocytosis   INTERVAL HISTORY Matthew Owen is a 60 y.o. male who has above history reviewed by me today presents for follow up visit for management of secondary erythrocytosis Problems and complaints are listed below:  Patient underwent phlebotomy during interval.  Patient is phlebotomy as needed if hematocrit is above 50.  Today he voices no new complaints. He is on chronic androgen replacement therapy  Review of Systems  Constitutional:  Negative for appetite change, chills, fatigue, fever and unexpected weight change.  HENT:   Negative for hearing loss and voice change.   Eyes:  Negative for eye problems and icterus.  Respiratory:  Negative for chest tightness, cough and shortness of breath.   Cardiovascular:  Negative for chest pain and leg swelling.  Gastrointestinal:  Negative for abdominal distention and abdominal pain.  Endocrine: Negative for hot flashes.  Genitourinary:  Negative for difficulty urinating, dysuria and frequency.   Musculoskeletal:  Negative for arthralgias.  Skin:  Negative for itching and rash.  Neurological:  Negative for light-headedness and numbness.  Hematological:  Negative for adenopathy. Does not bruise/bleed easily.  Psychiatric/Behavioral:  Negative for confusion.    Past Medical History:  Diagnosis Date   Erythrocytosis 01/24/2021    Hypertension    Low testosterone    No past surgical history on file.  Family History  Problem Relation Age of Onset   Diabetes Mother    Congestive Heart Failure Mother    Lung cancer Father    Heart disease Father     Social History   Socioeconomic History   Marital status: Married    Spouse name: Not on file   Number of children: Not on file   Years of education: Not on file   Highest education level: Not on file  Occupational History   Not on file  Tobacco Use   Smoking status: Never   Smokeless tobacco: Never  Vaping Use   Vaping Use: Never used  Substance and Sexual Activity   Alcohol use: Never   Drug use: Never   Sexual activity: Not on file  Other Topics Concern   Not on file  Social History Narrative   Not on file   Social Determinants of Health   Financial Resource Strain: Not on file  Food Insecurity: Not on file  Transportation Needs: Not on file  Physical Activity: Not on file  Stress: Not on file  Social Connections: Not on file  Intimate Partner Violence: Not on file    Current Outpatient Medications on File Prior to Visit  Medication Sig Dispense Refill   aspirin 81 MG chewable tablet Chew by mouth.     carvedilol (COREG) 12.5 MG tablet Take by mouth.     Coenzyme Q10 (CO Q-10) 200 MG CAPS Take by mouth.     LORazepam (ATIVAN) 2 MG tablet Take 2 mg by mouth every 8 (eight) hours as needed.     losartan (COZAAR) 100 MG tablet  Multiple Vitamin (MULTI-VITAMINS) TABS Take by mouth.     niacin 500 MG tablet Take 1,500 mg by mouth.      Omega-3 Fatty Acids (FISH OIL) 1000 MG CAPS Take by mouth.     rosuvastatin (CRESTOR) 20 MG tablet Take 1 tablet (20 mg total) by mouth daily. 90 tablet 3   testosterone cypionate (DEPOTESTOSTERONE CYPIONATE) 200 MG/ML injection      TURMERIC PO Take 1,000 mg by mouth daily.     valACYclovir (VALTREX) 500 MG tablet as needed.     No current facility-administered medications on file prior to visit.    No  Known Allergies     Observations/Objective: Today's Vitals   04/25/21 1357  BP: 108/78   There is no height or weight on file to calculate BMI.  Physical Exam Neurological:     Mental Status: He is alert.    CBC    Component Value Date/Time   WBC 7.1 02/28/2021 1247   RBC 4.77 02/28/2021 1247   HGB 15.0 04/25/2021 1048   HCT 44.7 04/25/2021 1048   PLT 185 02/28/2021 1247   MCV 97.3 02/28/2021 1247   MCH 34.0 02/28/2021 1247   MCHC 34.9 02/28/2021 1247   RDW 12.9 02/28/2021 1247   LYMPHSABS 1.7 02/28/2021 1247   MONOABS 0.9 02/28/2021 1247   EOSABS 0.1 02/28/2021 1247   BASOSABS 0.0 02/28/2021 1247    CMP     Component Value Date/Time   NA 134 (L) 10/14/2018 1542   K 4.1 10/14/2018 1542   CL 104 10/14/2018 1542   CO2 22 10/14/2018 1542   GLUCOSE 99 10/14/2018 1542   BUN 30 (H) 10/14/2018 1542   CREATININE 1.44 (H) 10/14/2018 1542   CALCIUM 9.3 10/14/2018 1542   PROT 7.8 10/14/2018 1542   ALBUMIN 4.0 10/14/2018 1542   AST 64 (H) 10/14/2018 1542   ALT 104 (H) 10/14/2018 1542   ALKPHOS 73 10/14/2018 1542   BILITOT 0.8 10/14/2018 1542   GFRNONAA 54 (L) 10/14/2018 1542   GFRAA >60 10/14/2018 1542     Assessment and Plan: 1. Erythrocytosis   2. 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 BCR ABL 1 FISH negative. Labs reviewed and discussed with patient.  Hematocrit is less than 50.  Hold off phlebotomy.  Follow Up Instructions: 8 weeks +/- phlebotomy.-Patient plans to have blood work done with primary care provider on 06/18/2021 and will provide copy of the test.  He may call and cancel phlebotomy if hematocrit is less than 50 Follow-up in 16 weeks with CBC, MD, +/- phlebotomy.   I discussed the assessment and treatment plan with the patient. The patient was provided an opportunity to ask  questions and all were answered. The patient agreed with the plan and demonstrated an understanding of the instructions.  The patient was advised to call back or seek an in-person evaluation if the symptoms worsen or if the condition fails to improve as anticipated.    Matthew Patience, MD 04/25/2021 8:12 PM

## 2021-05-02 ENCOUNTER — Inpatient Hospital Stay: Payer: Medicare Other

## 2021-06-25 ENCOUNTER — Other Ambulatory Visit: Payer: Self-pay

## 2021-06-25 ENCOUNTER — Telehealth: Payer: Self-pay | Admitting: *Deleted

## 2021-06-25 DIAGNOSIS — D751 Secondary polycythemia: Secondary | ICD-10-CM

## 2021-06-25 NOTE — Telephone Encounter (Signed)
Patient called to report that his Hgb was 15.3 please advise him on phlebotomy appointment.

## 2021-06-25 NOTE — Telephone Encounter (Signed)
Called patient to ask for HCT he states the MD did not include HCT in lab order. He is bringing a copy of the results to the cancer center either today or tomorrow.

## 2021-06-25 NOTE — Telephone Encounter (Signed)
Per last progress note in June: Patient plans to have blood work done with primary care provider on 06/18/2021 and will provide copy of the test.  He may call and cancel phlebotomy if hematocrit is less than 50.   Can you check with him if HCT level is available or if he can have results from PCP faxed to [place].

## 2021-06-25 NOTE — Telephone Encounter (Signed)
Talked to patient. He will need a HCT drawn. He is ok to r/s and requesting for it to be a Tuesday afternoon. Please schedule patient for lab/Phleb next tues (latest appt) and notify him of appt. Thanks

## 2021-06-26 ENCOUNTER — Encounter: Payer: Self-pay | Admitting: Oncology

## 2021-06-27 ENCOUNTER — Inpatient Hospital Stay: Payer: Medicare Other

## 2021-06-27 ENCOUNTER — Encounter: Payer: Self-pay | Admitting: Internal Medicine

## 2021-07-03 ENCOUNTER — Inpatient Hospital Stay: Payer: Medicare Other | Attending: Oncology

## 2021-07-03 ENCOUNTER — Inpatient Hospital Stay: Payer: Medicare Other

## 2021-07-03 DIAGNOSIS — D751 Secondary polycythemia: Secondary | ICD-10-CM | POA: Insufficient documentation

## 2021-07-03 LAB — HEMOGLOBIN AND HEMATOCRIT, BLOOD
HCT: 45.9 % (ref 39.0–52.0)
Hemoglobin: 15.1 g/dL (ref 13.0–17.0)

## 2021-07-03 NOTE — Progress Notes (Signed)
Current hematocrit= 45.9. According to treatment parameters, phlebotomy not indicated at this time. Pt made aware. He verbalized understanding.

## 2021-08-01 ENCOUNTER — Telehealth: Payer: Self-pay | Admitting: Cardiology

## 2021-08-01 NOTE — Telephone Encounter (Signed)
Pt c/o medication issue:  1. Name of Medication: rosuvastatin   2. How are you currently taking this medication (dosage and times per day)? 20 mg po q d   3. Are you having a reaction (difficulty breathing--STAT)? Body aches   4. What is your medication issue? Unable to tolerate please call patient to discuss

## 2021-08-03 MED ORDER — ROSUVASTATIN CALCIUM 10 MG PO TABS: 10.0000 mg | ORAL_TABLET | Freq: Every day | ORAL | 3 refills | Status: DC

## 2021-08-03 NOTE — Telephone Encounter (Signed)
Called and spoke with patients daughter Victorino Dike per DPR on file and relayed the following message. Patient was previously on 10 mg daily Crestor.  Reduce Crestor to 10 mg daily and monitor symptoms.  If patient still symptomatic, will consider switching to Pravachol 20 mg daily.    Victorino Dike was grateful for the call, she stated that she did cut patients dose back to 10 MG on Wed 08/01/21, and will let us know if his symptoms do not resolve.

## 2021-08-08 ENCOUNTER — Telehealth: Payer: Self-pay | Admitting: Cardiology

## 2021-08-08 MED ORDER — ROSUVASTATIN CALCIUM 10 MG PO TABS: 10.0000 mg | ORAL_TABLET | Freq: Every day | ORAL | 0 refills | Status: DC

## 2021-08-08 NOTE — Telephone Encounter (Signed)
*  STAT* If patient is at the pharmacy, call can be transferred to refill team.   1. Which medications need to be refilled? (please list name of each medication and dose if known) Crestor 10 mg 1 tablet daily  2. Which pharmacy/location (including street and city if local pharmacy) is medication to be sent to? [facility] [place]  3. Do they need a 30 day or 90 day supply? 90 day

## 2021-08-08 NOTE — Telephone Encounter (Signed)
Requested Prescriptions   Signed Prescriptions Disp Refills   rosuvastatin (CRESTOR) 10 MG tablet 90 tablet 0    Sig: Take 1 tablet (10 mg total) by mouth daily.    Authorizing Provider: Debbe Odea    Ordering User: Kendrick Fries

## 2021-08-14 ENCOUNTER — Other Ambulatory Visit: Payer: Self-pay

## 2021-08-14 ENCOUNTER — Encounter: Payer: Self-pay | Admitting: Oncology

## 2021-08-14 ENCOUNTER — Inpatient Hospital Stay: Payer: Medicare Other | Attending: Oncology

## 2021-08-14 ENCOUNTER — Inpatient Hospital Stay (HOSPITAL_BASED_OUTPATIENT_CLINIC_OR_DEPARTMENT_OTHER): Payer: Medicare Other | Admitting: Oncology

## 2021-08-14 DIAGNOSIS — D751 Secondary polycythemia: Secondary | ICD-10-CM

## 2021-08-14 DIAGNOSIS — Z79890 Hormone replacement therapy (postmenopausal): Secondary | ICD-10-CM

## 2021-08-14 LAB — CBC WITH DIFFERENTIAL/PLATELET
Abs Immature Granulocytes: 0.02 10*3/uL (ref 0.00–0.07)
Basophils Absolute: 0 10*3/uL (ref 0.0–0.1)
Basophils Relative: 1 %
Eosinophils Absolute: 0.2 10*3/uL (ref 0.0–0.5)
Eosinophils Relative: 3 %
HCT: 45.3 % (ref 39.0–52.0)
Hemoglobin: 14.9 g/dL (ref 13.0–17.0)
Immature Granulocytes: 0 %
Lymphocytes Relative: 24 %
Lymphs Abs: 1.6 10*3/uL (ref 0.7–4.0)
MCH: 27.8 pg (ref 26.0–34.0)
MCHC: 32.9 g/dL (ref 30.0–36.0)
MCV: 84.5 fL (ref 80.0–100.0)
Monocytes Absolute: 1 10*3/uL (ref 0.1–1.0)
Monocytes Relative: 15 %
Neutro Abs: 3.8 10*3/uL (ref 1.7–7.7)
Neutrophils Relative %: 57 %
Platelets: 220 10*3/uL (ref 150–400)
RBC: 5.36 MIL/uL (ref 4.22–5.81)
RDW: 17.3 % — ABNORMAL HIGH (ref 11.5–15.5)
WBC: 6.6 10*3/uL (ref 4.0–10.5)
nRBC: 0 % (ref 0.0–0.2)

## 2021-08-14 NOTE — Progress Notes (Signed)
HEMATOLOGY-ONCOLOGY TeleHEALTH VISIT PROGRESS NOTE  I connected with Matthew Owen on 08/14/21  at 10:30 AM EDT by video enabled telemedicine visit and verified that I am speaking with the correct person using two identifiers. I discussed the limitations, risks, security and privacy concerns of performing an evaluation and management service by telemedicine and the availability of in-person appointments. The patient expressed understanding and agreed to proceed.   Other persons participating in the visit and their role in the encounter:  None  Patient's location: Home  Provider's location: office Chief Complaint: Erythrocytosis   INTERVAL HISTORY Matthew Owen is a 60 y.o. male who has above history reviewed by me today presents for follow up visit for management of secondary erythrocytosis He is on chronic androgen replacement therapy. Instead of 100mg  every 10 days, patient is currently on 50 mg of testosterone every 5 days.  Review of Systems  Constitutional:  Negative for appetite change, chills, fatigue, fever and unexpected weight change.  HENT:   Negative for hearing loss and voice change.   Eyes:  Negative for eye problems and icterus.  Respiratory:  Negative for chest tightness, cough and shortness of breath.   Cardiovascular:  Negative for chest pain and leg swelling.  Gastrointestinal:  Negative for abdominal distention and abdominal pain.  Endocrine: Negative for hot flashes.  Genitourinary:  Negative for difficulty urinating, dysuria and frequency.   Musculoskeletal:  Negative for arthralgias.  Skin:  Negative for itching and rash.  Neurological:  Negative for light-headedness and numbness.  Hematological:  Negative for adenopathy. Does not bruise/bleed easily.  Psychiatric/Behavioral:  Negative for confusion.     Past Medical History:  Diagnosis Date   Erythrocytosis 01/24/2021   Hypertension    Low testosterone    History reviewed. No pertinent surgical history.   Family History  Problem Relation Age of Onset   Diabetes Mother    Congestive Heart Failure Mother    Lung cancer Father    Heart disease Father     Social History   Socioeconomic History   Marital status: Married    Spouse name: Not on file   Number of children: Not on file   Years of education: Not on file   Highest education level: Not on file  Occupational History   Not on file  Tobacco Use   Smoking status: Never   Smokeless tobacco: Never  Vaping Use   Vaping Use: Never used  Substance and Sexual Activity   Alcohol use: Never   Drug use: Never   Sexual activity: Not on file  Other Topics Concern   Not on file  Social History Narrative   Not on file   Social Determinants of Health   Financial Resource Strain: Not on file  Food Insecurity: Not on file  Transportation Needs: Not on file  Physical Activity: Not on file  Stress: Not on file  Social Connections: Not on file  Intimate Partner Violence: Not on file    Current Outpatient Medications on File Prior to Visit  Medication Sig Dispense Refill   amLODipine (NORVASC) 10 MG tablet Take by mouth.     aspirin 81 MG chewable tablet Chew by mouth.     carvedilol (COREG) 12.5 MG tablet Take by mouth.     Coenzyme Q10 (CO Q-10) 200 MG CAPS Take by mouth.     LORazepam (ATIVAN) 2 MG tablet Take 2 mg by mouth every 8 (eight) hours as needed.     losartan (COZAAR) 100 MG tablet  Multiple Vitamin (MULTI-VITAMINS) TABS Take by mouth.     niacin 500 MG tablet Take 1,500 mg by mouth.      Omega-3 Fatty Acids (FISH OIL) 1000 MG CAPS Take by mouth.     rosuvastatin (CRESTOR) 10 MG tablet Take 1 tablet (10 mg total) by mouth daily. 90 tablet 0   testosterone cypionate (DEPOTESTOSTERONE CYPIONATE) 200 MG/ML injection      TURMERIC PO Take 1,000 mg by mouth daily.     valACYclovir (VALTREX) 500 MG tablet as needed.     No current facility-administered medications on file prior to visit.    No Known Allergies      Observations/Objective: Today's Vitals   08/14/21 1007  PainSc: 0-No pain   There is no height or weight on file to calculate BMI.  Physical Exam Neurological:     Mental Status: He is alert.    CBC    Component Value Date/Time   WBC 6.6 08/14/2021 0831   RBC 5.36 08/14/2021 0831   HGB 14.9 08/14/2021 0831   HCT 45.3 08/14/2021 0831   PLT 220 08/14/2021 0831   MCV 84.5 08/14/2021 0831   MCH 27.8 08/14/2021 0831   MCHC 32.9 08/14/2021 0831   RDW 17.3 (H) 08/14/2021 0831   LYMPHSABS 1.6 08/14/2021 0831   MONOABS 1.0 08/14/2021 0831   EOSABS 0.2 08/14/2021 0831   BASOSABS 0.0 08/14/2021 0831    CMP     Component Value Date/Time   NA 134 (L) 10/14/2018 1542   K 4.1 10/14/2018 1542   CL 104 10/14/2018 1542   CO2 22 10/14/2018 1542   GLUCOSE 99 10/14/2018 1542   BUN 30 (H) 10/14/2018 1542   CREATININE 1.44 (H) 10/14/2018 1542   CALCIUM 9.3 10/14/2018 1542   PROT 7.8 10/14/2018 1542   ALBUMIN 4.0 10/14/2018 1542   AST 64 (H) 10/14/2018 1542   ALT 104 (H) 10/14/2018 1542   ALKPHOS 73 10/14/2018 1542   BILITOT 0.8 10/14/2018 1542   GFRNONAA 54 (L) 10/14/2018 1542   GFRAA >60 10/14/2018 1542     Assessment and Plan: 1. Erythrocytosis   2. 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 BCR ABL 1 FISH negative. Labs reviewed and discussed with patient Hemoglobin is stable on current testosterone replacement regimen.  Hematocrit is less than 50.  I will hold off phlebotomy.  Follow Up Instructions: 12 weeks +/- phlebotomy.Follow-up in 24weeks with CBC, MD, +/- phlebotomy.   I discussed the assessment and treatment plan with the patient. The patient was provided an opportunity to ask questions and all were answered. The patient agreed with the plan and demonstrated an understanding of the  instructions.  The patient was advised to call back or seek an in-person evaluation if the symptoms worsen or if the condition fails to improve as anticipated.    Rickard Patience, MD 08/14/2021 8:33 PM

## 2021-08-15 ENCOUNTER — Other Ambulatory Visit: Payer: BC Managed Care – PPO

## 2021-08-15 ENCOUNTER — Telehealth: Payer: BC Managed Care – PPO | Admitting: Oncology

## 2021-08-24 ENCOUNTER — Encounter: Payer: Self-pay | Admitting: Cardiology

## 2021-08-24 ENCOUNTER — Other Ambulatory Visit: Payer: Self-pay

## 2021-08-24 ENCOUNTER — Ambulatory Visit (INDEPENDENT_AMBULATORY_CARE_PROVIDER_SITE_OTHER): Payer: Medicare Other | Admitting: Cardiology

## 2021-08-24 VITALS — BP 110/76 | HR 82 | Ht 71.0 in | Wt 215.0 lb

## 2021-08-24 DIAGNOSIS — E7800 Pure hypercholesterolemia, unspecified: Secondary | ICD-10-CM

## 2021-08-24 DIAGNOSIS — I10 Essential (primary) hypertension: Secondary | ICD-10-CM | POA: Diagnosis not present

## 2021-08-24 DIAGNOSIS — I2510 Atherosclerotic heart disease of native coronary artery without angina pectoris: Secondary | ICD-10-CM | POA: Diagnosis not present

## 2021-08-24 MED ORDER — ROSUVASTATIN CALCIUM 10 MG PO TABS
10.0000 mg | ORAL_TABLET | Freq: Every day | ORAL | 3 refills | Status: DC
Start: 2021-08-24 — End: 2022-06-17

## 2021-08-24 NOTE — Patient Instructions (Signed)

## 2021-08-24 NOTE — Progress Notes (Signed)
Cardiology Office Note:    Date:  08/24/2021   ID:  Gerrell Tabet, DOB [birth year], MRN [id number]  PCP:  Jaclyn Shaggy, MD   [facility]  Cardiologist:  None  Advanced Practice Provider:  No care team member to display Electrophysiologist:  None       Referring MD: Jaclyn Shaggy, MD   Chief Complaint  Patient presents with   Other    6 month follow up. Meds reviewed verbally with patient.     History of Present Illness:    Matthew Owen is a 60 y.o. male with a hx of CAD (cor Ca2+ 583), hypertension, hyperlipidemia, low testosterone, erythrocytosis who presents for follow-up.  Previously seen for elevated calcium score of 583.  Crestor was increased to 20 mg, but patient developed myalgias.  He tolerates Crestor 10 mg daily.  He denies chest pain or shortness of breath.  Echocardiogram was obtained showing preserved ejection fraction, EF 60%.   Prior notes Echo 02/2021 EF 60 to 65% Patient had calcium score obtained on 02/21/2021 total score of 583, mostly concentrated in the LAD (577) which is 95th percentile MESA compared controls. Family history of MI in his father at age 17.  Currently tolerates Crestor 10 mg daily.  Past Medical History:  Diagnosis Date   Erythrocytosis 01/24/2021   Hypertension    Low testosterone     History reviewed. No pertinent surgical history.  Current Medications: Current Meds  Medication Sig   amLODipine (NORVASC) 10 MG tablet Take by mouth.   aspirin 81 MG chewable tablet Chew by mouth.   carvedilol (COREG) 12.5 MG tablet Take by mouth.   Coenzyme Q10 (CO Q-10) 200 MG CAPS Take by mouth.   LORazepam (ATIVAN) 2 MG tablet Take 2 mg by mouth every 8 (eight) hours as needed.   losartan (COZAAR) 100 MG tablet    Multiple Vitamin (MULTI-VITAMINS) TABS Take by mouth.   niacin 500 MG tablet Take 1,500 mg by mouth.    Omega-3 Fatty Acids (FISH OIL) 1000 MG CAPS Take by mouth.   testosterone cypionate (DEPOTESTOSTERONE  CYPIONATE) 200 MG/ML injection Inject 50 mg into the muscle. Every 5 days   TURMERIC PO Take 1,000 mg by mouth daily.   valACYclovir (VALTREX) 500 MG tablet as needed.   [DISCONTINUED] rosuvastatin (CRESTOR) 10 MG tablet Take 1 tablet (10 mg total) by mouth daily.     Allergies:   Patient has no known allergies.   Social History   Socioeconomic History   Marital status: Married    Spouse name: Not on file   Number of children: Not on file   Years of education: Not on file   Highest education level: Not on file  Occupational History   Not on file  Tobacco Use   Smoking status: Never   Smokeless tobacco: Never  Vaping Use   Vaping Use: Never used  Substance and Sexual Activity   Alcohol use: Never   Drug use: Never   Sexual activity: Not on file  Other Topics Concern   Not on file  Social History Narrative   Not on file   Social Determinants of Health   Financial Resource Strain: Not on file  Food Insecurity: Not on file  Transportation Needs: Not on file  Physical Activity: Not on file  Stress: Not on file  Social Connections: Not on file     Family History: The patient's family history includes Congestive Heart Failure in his mother; Diabetes  in his mother; Heart disease in his father; Lung cancer in his father.  ROS:   Please see the history of present illness.     All other systems reviewed and are negative.  EKGs/Labs/Other Studies Reviewed:    The following studies were reviewed today:  EKG:  EKG is  ordered today.  The ekg ordered today demonstrates normal sinus rhythm  Recent Labs: 08/14/2021: Hemoglobin 14.9; Platelets 220  Recent Lipid Panel No results found for: CHOL, TRIG, HDL, CHOLHDL, VLDL, LDLCALC, LDLDIRECT   Risk Assessment/Calculations:      Physical Exam:    VS:  BP 110/76 (BP Location: Left Arm, Patient Position: Sitting, Cuff Size: Large)   Pulse 82   Ht 5\' 11"  (1.803 m)   Wt 215 lb (97.5 kg)   SpO2 95%   BMI 29.99 kg/m      Wt Readings from Last 3 Encounters:  08/24/21 215 lb (97.5 kg)  02/23/21 220 lb (99.8 kg)  01/24/21 218 lb 6.4 oz (99.1 kg)     GEN:  Well nourished, well developed in no acute distress HEENT: Normal NECK: No JVD; No carotid bruits LYMPHATICS: No lymphadenopathy CARDIAC: RRR, no murmurs, rubs, gallops RESPIRATORY:  Clear to auscultation without rales, wheezing or rhonchi  ABDOMEN: Soft, non-tender, non-distended MUSCULOSKELETAL:  No edema; No deformity  SKIN: Warm and dry NEUROLOGIC:  Alert and oriented x 3 PSYCHIATRIC:  Normal affect   ASSESSMENT:    1. Coronary artery disease involving native coronary artery of native heart without angina pectoris   2. Primary hypertension   3. Pure hypercholesterolemia     PLAN:    In order of problems listed above:  CAD, very high coronary calcium score, 583.  Concentrated in the proximal LAD.  Last LDL 73.  Family history of early CAD.  Continue aspirin 81 mg, Crestor 20 mg daily.  Echocardiogram shows EF 60 to 65%, impaired relaxation.  Patient is asymptomatic.  Continue to monitor patient.  If patient was to develop symptoms consistent with angina in the future, will proceed directly with left heart cath due to high pretest probability. Hypertension, BP controlled.  Continue amlodipine, losartan, Coreg. Hyperlipidemia, goal LDL less than 70.  Continue Crestor 10 mg daily.  Cannot tolerate higher doses of Crestor.  Last LDL 73.  Follow-up in 6-12 months     Medication Adjustments/Labs and Tests Ordered: Current medicines are reviewed at length with the patient today.  Concerns regarding medicines are outlined above.  Orders Placed This Encounter  Procedures   EKG 12-Lead    Meds ordered this encounter  Medications   rosuvastatin (CRESTOR) 10 MG tablet    Sig: Take 1 tablet (10 mg total) by mouth daily.    Dispense:  90 tablet    Refill:  3     Patient Instructions  Medication Instructions:  Your physician recommends  that you continue on your current medications as directed. Please refer to the Current Medication list given to you today.  *If you need a refill on your cardiac medications before your next appointment, please call your pharmacy*   Lab Work: None ordered If you have labs (blood work) drawn today and your tests are completely normal, you will receive your results only by: MyChart Message (if you have MyChart) OR A paper copy in the mail If you have any lab test that is abnormal or we need to change your treatment, we will call you to review the results.   Testing/Procedures: None ordered  Follow-Up: At [facility], you and your health needs are our priority.  As part of our continuing mission to provide you with exceptional heart care, we have created designated Provider Care Teams.  These Care Teams include your primary Cardiologist (physician) and Advanced Practice Providers (APPs -  Physician Assistants and Nurse Practitioners) who all work together to provide you with the care you need, when you need it.  We recommend signing up for the patient portal called "MyChart".  Sign up information is provided on this After Visit Summary.  MyChart is used to connect with patients for Virtual Visits (Telemedicine).  Patients are able to view lab/test results, encounter notes, upcoming appointments, etc.  Non-urgent messages can be sent to your provider as well.   To learn more about what you can do with MyChart, go to [email or web address].    Your next appointment:   1 year(s)  The format for your next appointment:   In Person  Provider:   Debbe Odea, MD   Other Instructions    Signed, Debbe Odea, MD  08/24/2021 4:56 PM    [facility]

## 2021-11-14 ENCOUNTER — Other Ambulatory Visit: Payer: Self-pay

## 2021-11-14 ENCOUNTER — Inpatient Hospital Stay: Payer: Medicare Other

## 2021-11-14 ENCOUNTER — Inpatient Hospital Stay: Payer: Medicare Other | Attending: Oncology

## 2021-11-14 DIAGNOSIS — D751 Secondary polycythemia: Secondary | ICD-10-CM | POA: Diagnosis not present

## 2021-11-14 LAB — CBC WITH DIFFERENTIAL/PLATELET
Abs Immature Granulocytes: 0.01 10*3/uL (ref 0.00–0.07)
Basophils Absolute: 0 10*3/uL (ref 0.0–0.1)
Basophils Relative: 0 %
Eosinophils Absolute: 0.2 10*3/uL (ref 0.0–0.5)
Eosinophils Relative: 2 %
HCT: 45.2 % (ref 39.0–52.0)
Hemoglobin: 14.9 g/dL (ref 13.0–17.0)
Immature Granulocytes: 0 %
Lymphocytes Relative: 25 %
Lymphs Abs: 1.8 10*3/uL (ref 0.7–4.0)
MCH: 28.7 pg (ref 26.0–34.0)
MCHC: 33 g/dL (ref 30.0–36.0)
MCV: 87.1 fL (ref 80.0–100.0)
Monocytes Absolute: 1 10*3/uL (ref 0.1–1.0)
Monocytes Relative: 14 %
Neutro Abs: 4.3 10*3/uL (ref 1.7–7.7)
Neutrophils Relative %: 59 %
Platelets: 207 10*3/uL (ref 150–400)
RBC: 5.19 MIL/uL (ref 4.22–5.81)
RDW: 15.8 % — ABNORMAL HIGH (ref 11.5–15.5)
WBC: 7.3 10*3/uL (ref 4.0–10.5)
nRBC: 0 % (ref 0.0–0.2)

## 2021-11-14 NOTE — Progress Notes (Signed)
HCT is 45.2 today. Per phlebotomy parameters; stating to perform if HCT is > 50. No phlebotomy is required. Patient informed of results.

## 2022-02-05 ENCOUNTER — Telehealth: Payer: Self-pay

## 2022-02-05 ENCOUNTER — Encounter: Payer: Self-pay | Admitting: Nephrology

## 2022-02-05 NOTE — Telephone Encounter (Signed)
Dr. Cathie Hoops has reviewed labs and no lab/Phleb is needed on 4/4. Appts will be cancelled. Patient informed of cancellation of appts and to keep Mychart appt on 4/5.  ?

## 2022-02-05 NOTE — Telephone Encounter (Signed)
-----   Message from Evelina Dun, RN sent at 02/05/2022  1:25 PM EDT ----- ?Pt had CBC, renal fuction panel and testoterone labs drawn at Dr. Raechel Ache office on 3/20. He is scheduled for lab (cbc) / poss phleb on 4/4 and Mychart on 4/5. Do you want repeat labs on 4/4? ?----- Message ----- ?From: Secundino Ginger ?Sent: 02/05/2022   1:14 PM EDT ?To: Evelina Dun, RN ? ? ?

## 2022-02-12 ENCOUNTER — Other Ambulatory Visit: Payer: BC Managed Care – PPO

## 2022-02-13 ENCOUNTER — Encounter: Payer: Self-pay | Admitting: Oncology

## 2022-02-13 ENCOUNTER — Inpatient Hospital Stay: Payer: Medicare Other | Attending: Oncology | Admitting: Oncology

## 2022-02-13 DIAGNOSIS — Z801 Family history of malignant neoplasm of trachea, bronchus and lung: Secondary | ICD-10-CM | POA: Diagnosis not present

## 2022-02-13 DIAGNOSIS — Z79899 Other long term (current) drug therapy: Secondary | ICD-10-CM | POA: Insufficient documentation

## 2022-02-13 DIAGNOSIS — T387X5A Adverse effect of androgens and anabolic congeners, initial encounter: Secondary | ICD-10-CM | POA: Diagnosis not present

## 2022-02-13 DIAGNOSIS — Z79890 Hormone replacement therapy (postmenopausal): Secondary | ICD-10-CM

## 2022-02-13 DIAGNOSIS — D751 Secondary polycythemia: Secondary | ICD-10-CM | POA: Diagnosis present

## 2022-02-13 DIAGNOSIS — I10 Essential (primary) hypertension: Secondary | ICD-10-CM | POA: Diagnosis not present

## 2022-02-13 NOTE — Progress Notes (Signed)
Patient called/ pre- screened for virtual appoinment today with oncologist. No new concerns  

## 2022-02-13 NOTE — Progress Notes (Signed)
HEMATOLOGY-ONCOLOGY TeleHEALTH VISIT PROGRESS NOTE  ?I connected with Matthew Owen on 02/13/22  at  2:45 PM EDT by video enabled telemedicine visit and verified that I am speaking with the correct person using two identifiers. ?I discussed the limitations, risks, security and privacy concerns of performing an evaluation and management service by telemedicine and the availability of in-person appointments. The patient expressed understanding and agreed to proceed.  ? ?Other persons participating in the visit and their role in the encounter:  ?None ? ?Patient's location: Home  ?Provider's location: office ?Chief Complaint: Erythrocytosis ? ? ?INTERVAL HISTORY ?Matthew Owen is a 61 y.o. male who has above history reviewed by me today presents for follow up visit for management of secondary erythrocytosis ?He is on chronic androgen replacement therapy. ?Patient reports that recently he had a testosterone testing done which showed level was high.  So he decreased dosage to 50 mg of testosterone every 8 days. ?He has no new complaints.  He feels well today. ? ?Review of Systems  ?Constitutional:  Negative for appetite change, chills, fatigue, fever and unexpected weight change.  ?HENT:   Negative for hearing loss and voice change.   ?Eyes:  Negative for eye problems and icterus.  ?Respiratory:  Negative for chest tightness, cough and shortness of breath.   ?Cardiovascular:  Negative for chest pain and leg swelling.  ?Gastrointestinal:  Negative for abdominal distention and abdominal pain.  ?Endocrine: Negative for hot flashes.  ?Genitourinary:  Negative for difficulty urinating, dysuria and frequency.   ?Musculoskeletal:  Negative for arthralgias.  ?Skin:  Negative for itching and rash.  ?Neurological:  Negative for light-headedness and numbness.  ?Hematological:  Negative for adenopathy. Does not bruise/bleed easily.  ?Psychiatric/Behavioral:  Negative for confusion.    ? ?Past Medical History:  ?Diagnosis Date  ?  Erythrocytosis 01/24/2021  ? Hypertension   ? Low testosterone   ? ?History reviewed. No pertinent surgical history.  ?Family History  ?Problem Relation Age of Onset  ? Diabetes Mother   ? Congestive Heart Failure Mother   ? Lung cancer Father   ? Heart disease Father   ?  ?Social History  ? ?Socioeconomic History  ? Marital status: Married  ?  Spouse name: Not on file  ? Number of children: Not on file  ? Years of education: Not on file  ? Highest education level: Not on file  ?Occupational History  ? Not on file  ?Tobacco Use  ? Smoking status: Never  ? Smokeless tobacco: Never  ?Vaping Use  ? Vaping Use: Never used  ?Substance and Sexual Activity  ? Alcohol use: Never  ? Drug use: Never  ? Sexual activity: Not on file  ?Other Topics Concern  ? Not on file  ?Social History Narrative  ? Not on file  ? ?Social Determinants of Health  ? ?Financial Resource Strain: Not on file  ?Food Insecurity: Not on file  ?Transportation Needs: Not on file  ?Physical Activity: Not on file  ?Stress: Not on file  ?Social Connections: Not on file  ?Intimate Partner Violence: Not on file  ?  ?Current Outpatient Medications on File Prior to Visit  ?Medication Sig Dispense Refill  ? amLODipine (NORVASC) 10 MG tablet Take by mouth.    ? aspirin 81 MG chewable tablet Chew by mouth.    ? carvedilol (COREG) 12.5 MG tablet Take by mouth. 6.25g    ? Coenzyme Q10 (CO Q-10) 200 MG CAPS Take by mouth.    ? LORazepam (ATIVAN) 2  MG tablet Take 2 mg by mouth every 8 (eight) hours as needed.    ? losartan (COZAAR) 100 MG tablet     ? MAGNESIUM PO Take by mouth.    ? Multiple Vitamin (MULTI-VITAMINS) TABS Take by mouth.    ? niacin 500 MG tablet Take 1,500 mg by mouth.     ? Omega-3 Fatty Acids (FISH OIL) 1000 MG CAPS Take by mouth.    ? rosuvastatin (CRESTOR) 10 MG tablet Take 1 tablet (10 mg total) by mouth daily. 90 tablet 3  ? testosterone cypionate (DEPOTESTOSTERONE CYPIONATE) 200 MG/ML injection Inject 50 mg into the muscle. Every 5 days    ?  TURMERIC PO Take 1,000 mg by mouth daily.    ? valACYclovir (VALTREX) 500 MG tablet as needed.    ? ?No current facility-administered medications on file prior to visit.  ?  ?No Known Allergies  ? ?  ?Observations/Objective: ?Today's Vitals  ? 02/13/22 1403  ?PainSc: 0-No pain  ? ?There is no height or weight on file to calculate BMI.  ?Physical Exam ?Neurological:  ?   Mental Status: He is alert.  ? ? ?CBC ?   ?Component Value Date/Time  ? WBC 7.3 11/14/2021 1334  ? RBC 5.19 11/14/2021 1334  ? HGB 14.9 11/14/2021 1334  ? HCT 45.2 11/14/2021 1334  ? PLT 207 11/14/2021 1334  ? MCV 87.1 11/14/2021 1334  ? MCH 28.7 11/14/2021 1334  ? MCHC 33.0 11/14/2021 1334  ? RDW 15.8 (H) 11/14/2021 1334  ? LYMPHSABS 1.8 11/14/2021 1334  ? MONOABS 1.0 11/14/2021 1334  ? EOSABS 0.2 11/14/2021 1334  ? BASOSABS 0.0 11/14/2021 1334  ?  ?CMP  ?   ?Component Value Date/Time  ? NA 134 (L) 10/14/2018 1542  ? K 4.1 10/14/2018 1542  ? CL 104 10/14/2018 1542  ? CO2 22 10/14/2018 1542  ? GLUCOSE 99 10/14/2018 1542  ? BUN 30 (H) 10/14/2018 1542  ? CREATININE 1.44 (H) 10/14/2018 1542  ? CALCIUM 9.3 10/14/2018 1542  ? PROT 7.8 10/14/2018 1542  ? ALBUMIN 4.0 10/14/2018 1542  ? AST 64 (H) 10/14/2018 1542  ? ALT 104 (H) 10/14/2018 1542  ? ALKPHOS 73 10/14/2018 1542  ? BILITOT 0.8 10/14/2018 1542  ? GFRNONAA 54 (L) 10/14/2018 1542  ? GFRAA >60 10/14/2018 1542  ?  ? ?Assessment and Plan: ?1. Secondary erythrocytosis   ?2. Long-term current use of testosterone replacement therapy   ?  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 ?JAK2 V617F mutation negative, with reflex to other mutations CALR, MPL, JAK 2 Ex 12-15 mutations negative. ?BCR ABL 1 FISH negative. ?Labs reviewed and discussed with ?Hematocrit is 46, hold off phlebotomy today. ?Given that his count has been stable, and he has not needed any phlebotomy recently, currently on lower dose of testosterone replacement therapy.  I recommend patient to follow-up in 6 months, repeat lab, MD +/-  phlebotomy. ?Patient prefers virtual visit. ? ?I discussed the assessment and treatment plan with the patient. The patient was provided an opportunity to ask questions and all were answered. The patient agreed with the plan and demonstrated an understanding of the instructions.  ?The patient was advised to call back or seek an in-person evaluation if the symptoms worsen or if the condition fails to improve as anticipated.  ? ? ?Rickard Patience, MD 02/13/2022 11:33 PM  ? ?

## 2022-02-15 ENCOUNTER — Encounter: Payer: Self-pay | Admitting: Oncology

## 2022-06-17 ENCOUNTER — Other Ambulatory Visit: Payer: Self-pay

## 2022-06-17 MED ORDER — ROSUVASTATIN CALCIUM 10 MG PO TABS: 10.0000 mg | ORAL_TABLET | Freq: Every day | ORAL | 3 refills | Status: DC

## 2022-08-19 ENCOUNTER — Ambulatory Visit: Payer: Medicare Other | Attending: Cardiology | Admitting: Cardiology

## 2022-08-19 ENCOUNTER — Inpatient Hospital Stay: Payer: Medicare Other | Attending: Oncology

## 2022-08-19 ENCOUNTER — Encounter: Payer: Self-pay | Admitting: Cardiology

## 2022-08-19 VITALS — BP 146/84 | HR 66 | Ht 71.0 in | Wt 214.6 lb

## 2022-08-19 DIAGNOSIS — E7800 Pure hypercholesterolemia, unspecified: Secondary | ICD-10-CM | POA: Diagnosis present

## 2022-08-19 DIAGNOSIS — Z801 Family history of malignant neoplasm of trachea, bronchus and lung: Secondary | ICD-10-CM | POA: Diagnosis not present

## 2022-08-19 DIAGNOSIS — Z79899 Other long term (current) drug therapy: Secondary | ICD-10-CM | POA: Insufficient documentation

## 2022-08-19 DIAGNOSIS — I2510 Atherosclerotic heart disease of native coronary artery without angina pectoris: Secondary | ICD-10-CM | POA: Insufficient documentation

## 2022-08-19 DIAGNOSIS — T387X5A Adverse effect of androgens and anabolic congeners, initial encounter: Secondary | ICD-10-CM | POA: Insufficient documentation

## 2022-08-19 DIAGNOSIS — D751 Secondary polycythemia: Secondary | ICD-10-CM | POA: Diagnosis not present

## 2022-08-19 DIAGNOSIS — I10 Essential (primary) hypertension: Secondary | ICD-10-CM | POA: Insufficient documentation

## 2022-08-19 LAB — COMPREHENSIVE METABOLIC PANEL
ALT: 37 U/L (ref 0–44)
AST: 50 U/L — ABNORMAL HIGH (ref 15–41)
Albumin: 3.8 g/dL (ref 3.5–5.0)
Alkaline Phosphatase: 68 U/L (ref 38–126)
Anion gap: 7 (ref 5–15)
BUN: 27 mg/dL — ABNORMAL HIGH (ref 6–20)
CO2: 25 mmol/L (ref 22–32)
Calcium: 9.1 mg/dL (ref 8.9–10.3)
Chloride: 101 mmol/L (ref 98–111)
Creatinine, Ser: 1.1 mg/dL (ref 0.61–1.24)
GFR, Estimated: >60 mL/min (ref >60)
Glucose, Bld: 98 mg/dL (ref 70–99)
Potassium: 3.8 mmol/L (ref 3.5–5.1)
Sodium: 133 mmol/L — ABNORMAL LOW (ref 135–145)
Total Bilirubin: 0.6 mg/dL (ref 0.3–1.2)
Total Protein: 7.5 g/dL (ref 6.5–8.1)

## 2022-08-19 LAB — CBC WITH DIFFERENTIAL/PLATELET
Abs Immature Granulocytes: 0.01 10*3/uL (ref 0.00–0.07)
Basophils Absolute: 0 10*3/uL (ref 0.0–0.1)
Basophils Relative: 0 %
Eosinophils Absolute: 0.2 10*3/uL (ref 0.0–0.5)
Eosinophils Relative: 2 %
HCT: 47.3 % (ref 39.0–52.0)
Hemoglobin: 16.1 g/dL (ref 13.0–17.0)
Immature Granulocytes: 0 %
Lymphocytes Relative: 25 %
Lymphs Abs: 1.8 10*3/uL (ref 0.7–4.0)
MCH: 30.4 pg (ref 26.0–34.0)
MCHC: 34 g/dL (ref 30.0–36.0)
MCV: 89.2 fL (ref 80.0–100.0)
Monocytes Absolute: 1 10*3/uL (ref 0.1–1.0)
Monocytes Relative: 14 %
Neutro Abs: 4.3 10*3/uL (ref 1.7–7.7)
Neutrophils Relative %: 59 %
Platelets: 205 10*3/uL (ref 150–400)
RBC: 5.3 MIL/uL (ref 4.22–5.81)
RDW: 17.2 % — ABNORMAL HIGH (ref 11.5–15.5)
WBC: 7.3 10*3/uL (ref 4.0–10.5)
nRBC: 0 % (ref 0.0–0.2)

## 2022-08-19 NOTE — Progress Notes (Signed)
Cardiology Office Note:    Date:  08/19/2022   ID:  Matthew Owen, DOB 28-Jun-1961, MRN [id number]  PCP:  Albina Billet, MD   [place]  Cardiologist:  None  Advanced Practice Provider:  No care team member to display Electrophysiologist:  None       Referring MD: Albina Billet, MD   Chief Complaint  Patient presents with   Follow-up    Annual F/U, no new Cardiac concerns     History of Present Illness:    Matthew Owen is a 61 y.o. male with a hx of CAD (cor Ca2+ 94), hypertension, hyperlipidemia, low testosterone, erythrocytosis who presents for follow-up.  Denies any symptoms of chest pain or shortness of breath.  Takes aspirin, Crestor as prescribed.  Currently on Crestor 10 mg daily ,he did not previously tolerate Crestor 20 mg.  Overall states feeling well, no concerns at this time.  Prior notes Echo 02/2021 EF 60 to 65% Patient had calcium score obtained on 02/21/2021 total score of 583, mostly concentrated in the LAD (577) which is 95th percentile MESA compared controls. Family history of MI in his father at age 78.  Currently tolerates Crestor 10 mg daily.  Past Medical History:  Diagnosis Date   Erythrocytosis 01/24/2021   Hypertension    Low testosterone     History reviewed. No pertinent surgical history.  Current Medications: Current Meds  Medication Sig   carvedilol (COREG) 12.5 MG tablet Take by mouth. 6.25g     Allergies:   Patient has no known allergies.   Social History   Socioeconomic History   Marital status: Married    Spouse name: Not on file   Number of children: Not on file   Years of education: Not on file   Highest education level: Not on file  Occupational History   Not on file  Tobacco Use   Smoking status: Never   Smokeless tobacco: Never  Vaping Use   Vaping Use: Never used  Substance and Sexual Activity   Alcohol use: Never    Comment: occasiol beer   Drug use: Never   Sexual activity: Not on file   Other Topics Concern   Not on file  Social History Narrative   Not on file   Social Determinants of Health   Financial Resource Strain: Not on file  Food Insecurity: Not on file  Transportation Needs: Not on file  Physical Activity: Not on file  Stress: Not on file  Social Connections: Not on file     Family History: The patient's family history includes Congestive Heart Failure in his mother; Diabetes in his mother; Heart disease in his father; Lung cancer in his father.  ROS:   Please see the history of present illness.     All other systems reviewed and are negative.  EKGs/Labs/Other Studies Reviewed:    The following studies were reviewed today:  EKG:  EKG is  ordered today.  The ekg ordered today demonstrates normal sinus rhythm  Recent Labs: 11/14/2021: Hemoglobin 14.9; Platelets 207  Recent Lipid Panel No results found for: "CHOL", "TRIG", "HDL", "CHOLHDL", "VLDL", "LDLCALC", "LDLDIRECT"   Risk Assessment/Calculations:      Physical Exam:    VS:  BP (!) 146/84 (BP Location: Left Arm)   Pulse 66   Ht 5\' 11"  (5.809 m)   Wt 214 lb 9.6 oz (97.3 kg)   SpO2 96%   BMI 29.93 kg/m     Wt Readings from  Last 3 Encounters:  08/19/22 214 lb 9.6 oz (97.3 kg)  08/24/21 215 lb (97.5 kg)  02/23/21 220 lb (99.8 kg)     GEN:  Well nourished, well developed in no acute distress HEENT: Normal NECK: No JVD; No carotid bruits CARDIAC: RRR, no murmurs, rubs, gallops RESPIRATORY:  Clear to auscultation without rales, wheezing or rhonchi  ABDOMEN: Soft, non-tender, non-distended MUSCULOSKELETAL:  No edema; No deformity  SKIN: Warm and dry NEUROLOGIC:  Alert and oriented x 3 PSYCHIATRIC:  Normal affect   ASSESSMENT:    1. Coronary artery disease involving native coronary artery of native heart without angina pectoris   2. Primary hypertension   3. Pure hypercholesterolemia    PLAN:    In order of problems listed above:  CAD, high coronary calcium score, 583.   Concentrated in the proximal LAD.  Last LDL 73.  Family history of early CAD.  Continue aspirin 81 mg, Crestor 10 mg daily.  Echocardiogram shows EF 60 to 65%, impaired relaxation.  Patient is asymptomatic Hypertension, BP controlled.  Continue amlodipine, losartan, Coreg. Hyperlipidemia, goal LDL less than 70.  Continue Crestor 10 mg daily.  Cannot tolerate higher doses of Crestor.  Last LDL 73.  Follow-up in 12 months    Medication Adjustments/Labs and Tests Ordered: Current medicines are reviewed at length with the patient today.  Concerns regarding medicines are outlined above.  Orders Placed This Encounter  Procedures   EKG 12-Lead    No orders of the defined types were placed in this encounter.    Patient Instructions  Medication Instructions:  Your physician recommends that you continue on your current medications as directed. Please refer to the Current Medication list given to you today.  *If you need a refill on your cardiac medications before your next appointment, please call your pharmacy*    Follow-Up: At [facility], you and your health needs are our priority.  As part of our continuing mission to provide you with exceptional heart care, we have created designated Provider Care Teams.  These Care Teams include your primary Cardiologist (physician) and Advanced Practice Providers (APPs -  Physician Assistants and Nurse Practitioners) who all work together to provide you with the care you need, when you need it.  We recommend signing up for the patient portal called "MyChart".  Sign up information is provided on this After Visit Summary.  MyChart is used to connect with patients for Virtual Visits (Telemedicine).  Patients are able to view lab/test results, encounter notes, upcoming appointments, etc.  Non-urgent messages can be sent to your provider as well.   To learn more about what you can do with MyChart, go to [email or web address].    Your next  appointment:   1 year(s)  The format for your next appointment:   In Person  Provider:   Debbe Odea, MD    Other Instructions   Important Information About Sugar         Signed, Debbe Odea, MD  08/19/2022 12:07 PM    [facility]

## 2022-08-19 NOTE — Patient Instructions (Signed)
Medication Instructions:   Your physician recommends that you continue on your current medications as directed. Please refer to the Current Medication list given to you today.  *If you need a refill on your cardiac medications before your next appointment, please call your pharmacy*   Follow-Up: At [facility], you and your health needs are our priority.  As part of our continuing mission to provide you with exceptional heart care, we have created designated Provider Care Teams.  These Care Teams include your primary Cardiologist (physician) and Advanced Practice Providers (APPs -  Physician Assistants and Nurse Practitioners) who all work together to provide you with the care you need, when you need it.  We recommend signing up for the patient portal called "MyChart".  Sign up information is provided on this After Visit Summary.  MyChart is used to connect with patients for Virtual Visits (Telemedicine).  Patients are able to view lab/test results, encounter notes, upcoming appointments, etc.  Non-urgent messages can be sent to your provider as well.   To learn more about what you can do with MyChart, go to [email or web address].    Your next appointment:   1 year(s)  The format for your next appointment:   In Person  Provider:   Brian Agbor-Etang, MD    Other Instructions   Important Information About Sugar       

## 2022-08-21 ENCOUNTER — Inpatient Hospital Stay (HOSPITAL_BASED_OUTPATIENT_CLINIC_OR_DEPARTMENT_OTHER): Payer: Medicare Other | Admitting: Oncology

## 2022-08-21 ENCOUNTER — Encounter: Payer: Self-pay | Admitting: Oncology

## 2022-08-21 DIAGNOSIS — D751 Secondary polycythemia: Secondary | ICD-10-CM | POA: Diagnosis not present

## 2022-08-22 ENCOUNTER — Encounter: Payer: Self-pay | Admitting: Oncology

## 2022-08-22 NOTE — Progress Notes (Signed)
HEMATOLOGY-ONCOLOGY TeleHEALTH VISIT PROGRESS NOTE  I connected with Matthew Owen on 08/22/22  at  2:45 PM EDT by video enabled telemedicine visit and verified that I am speaking with the correct person using two identifiers. I discussed the limitations, risks, security and privacy concerns of performing an evaluation and management service by telemedicine and the availability of in-person appointments. The patient expressed understanding and agreed to proceed.   Other persons participating in the visit and their role in the encounter:  None  Patient's location: Home  Provider's location: office Chief Complaint: Erythrocytosis   INTERVAL HISTORY Rochelle Nephew is a 61 y.o. male who has above history reviewed by me today presents for follow up visit for management of secondary erythrocytosis He is on chronic androgen replacement therapy. Patient has no new complaints.  Review of Systems  Constitutional:  Negative for appetite change, chills, fatigue, fever and unexpected weight change.  HENT:   Negative for hearing loss and voice change.   Eyes:  Negative for eye problems and icterus.  Respiratory:  Negative for chest tightness, cough and shortness of breath.   Cardiovascular:  Negative for chest pain and leg swelling.  Gastrointestinal:  Negative for abdominal distention and abdominal pain.  Endocrine: Negative for hot flashes.  Genitourinary:  Negative for difficulty urinating, dysuria and frequency.   Musculoskeletal:  Negative for arthralgias.  Skin:  Negative for itching and rash.  Neurological:  Negative for light-headedness and numbness.  Hematological:  Negative for adenopathy. Does not bruise/bleed easily.  Psychiatric/Behavioral:  Negative for confusion.      Past Medical History:  Diagnosis Date   Erythrocytosis 01/24/2021   Hypertension    Low testosterone    History reviewed. No pertinent surgical history.  Family History  Problem Relation Age of Onset   Diabetes  Mother    Congestive Heart Failure Mother    Lung cancer Father    Heart disease Father     Social History   Socioeconomic History   Marital status: Married    Spouse name: Not on file   Number of children: Not on file   Years of education: Not on file   Highest education level: Not on file  Occupational History   Not on file  Tobacco Use   Smoking status: Never   Smokeless tobacco: Never  Vaping Use   Vaping Use: Never used  Substance and Sexual Activity   Alcohol use: Never    Comment: occasiol beer   Drug use: Never   Sexual activity: Not on file  Other Topics Concern   Not on file  Social History Narrative   Not on file   Social Determinants of Health   Financial Resource Strain: Not on file  Food Insecurity: Not on file  Transportation Needs: Not on file  Physical Activity: Not on file  Stress: Not on file  Social Connections: Not on file  Intimate Partner Violence: Not on file    Current Outpatient Medications on File Prior to Visit  Medication Sig Dispense Refill   amLODipine (NORVASC) 5 MG tablet Take 5 mg by mouth daily.     aspirin 81 MG chewable tablet Chew by mouth.     carvedilol (COREG) 6.25 MG tablet Take 6.25 mg by mouth daily.     Coenzyme Q10 (CO Q-10) 200 MG CAPS Take by mouth.     LORazepam (ATIVAN) 2 MG tablet Take 2 mg by mouth every 8 (eight) hours as needed.     losartan (COZAAR) 100 MG  tablet      MAGNESIUM PO Take by mouth.     Multiple Vitamin (MULTI-VITAMINS) TABS Take by mouth.     niacin 500 MG tablet Take 1,000 mg by mouth.     Omega-3 Fatty Acids (FISH OIL) 1000 MG CAPS Take 2,500 mg by mouth 3 (three) times daily.     rosuvastatin (CRESTOR) 10 MG tablet Take 1 tablet (10 mg total) by mouth daily. 90 tablet 3   testosterone cypionate (DEPOTESTOSTERONE CYPIONATE) 200 MG/ML injection Inject 50 mg into the muscle. Every 5 days     TURMERIC PO Take 1,000 mg by mouth daily.     valACYclovir (VALTREX) 500 MG tablet as needed.      amLODipine (NORVASC) 10 MG tablet Take 5 mg by mouth daily. (Patient not taking: Reported on 08/21/2022)     carvedilol (COREG) 12.5 MG tablet Take by mouth. 6.25g (Patient not taking: Reported on 08/21/2022)     No current facility-administered medications on file prior to visit.    No Known Allergies     Observations/Objective: Today's Vitals   08/21/22 1215  PainSc: 0-No pain   There is no height or weight on file to calculate BMI.  Physical Exam Neurological:     Mental Status: He is alert.     CBC    Component Value Date/Time   WBC 7.3 08/19/2022 1543   RBC 5.30 08/19/2022 1543   HGB 16.1 08/19/2022 1543   HCT 47.3 08/19/2022 1543   PLT 205 08/19/2022 1543   MCV 89.2 08/19/2022 1543   MCH 30.4 08/19/2022 1543   MCHC 34.0 08/19/2022 1543   RDW 17.2 (H) 08/19/2022 1543   LYMPHSABS 1.8 08/19/2022 1543   MONOABS 1.0 08/19/2022 1543   EOSABS 0.2 08/19/2022 1543   BASOSABS 0.0 08/19/2022 1543    CMP     Component Value Date/Time   NA 133 (L) 08/19/2022 1543   K 3.8 08/19/2022 1543   CL 101 08/19/2022 1543   CO2 25 08/19/2022 1543   GLUCOSE 98 08/19/2022 1543   BUN 27 (H) 08/19/2022 1543   CREATININE 1.10 08/19/2022 1543   CALCIUM 9.1 08/19/2022 1543   PROT 7.5 08/19/2022 1543   ALBUMIN 3.8 08/19/2022 1543   AST 50 (H) 08/19/2022 1543   ALT 37 08/19/2022 1543   ALKPHOS 68 08/19/2022 1543   BILITOT 0.6 08/19/2022 1543   GFRNONAA >60 08/19/2022 1543   GFRAA >60 10/14/2018 1542     Assessment and Plan: 1. 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 JAK2 V617F mutation negative, with reflex to other mutations CALR, MPL, JAK 2 Ex 12-15 mutations negative. BCR ABL 1 FISH negative. Labs are reviewed and discussed with patient. Hematocrit is less than 50.  I will hold off phlebotomy.  follow-up in 6 months, repeat lab, MD +/- phlebotomy. Patient prefers virtual visit.  I discussed the assessment and treatment plan with the  patient. The patient was provided an opportunity to ask questions and all were answered. The patient agreed with the plan and demonstrated an understanding of the instructions.  The patient was advised to call back or seek an in-person evaluation if the symptoms worsen or if the condition fails to improve as anticipated.    Earlie Server, MD 08/22/2022 8:30 AM

## 2023-02-14 ENCOUNTER — Inpatient Hospital Stay: Payer: Medicare Other | Attending: Oncology

## 2023-02-14 DIAGNOSIS — Z79899 Other long term (current) drug therapy: Secondary | ICD-10-CM | POA: Diagnosis not present

## 2023-02-14 DIAGNOSIS — D751 Secondary polycythemia: Secondary | ICD-10-CM

## 2023-02-14 LAB — CBC WITH DIFFERENTIAL/PLATELET
Abs Immature Granulocytes: 0.02 10*3/uL (ref 0.00–0.07)
Basophils Absolute: 0 10*3/uL (ref 0.0–0.1)
Basophils Relative: 1 %
Eosinophils Absolute: 0.1 10*3/uL (ref 0.0–0.5)
Eosinophils Relative: 2 %
HCT: 46.3 % (ref 39.0–52.0)
Hemoglobin: 15.8 g/dL (ref 13.0–17.0)
Immature Granulocytes: 0 %
Lymphocytes Relative: 27 %
Lymphs Abs: 1.4 10*3/uL (ref 0.7–4.0)
MCH: 33 pg (ref 26.0–34.0)
MCHC: 34.1 g/dL (ref 30.0–36.0)
MCV: 96.7 fL (ref 80.0–100.0)
Monocytes Absolute: 0.7 10*3/uL (ref 0.1–1.0)
Monocytes Relative: 14 %
Neutro Abs: 2.9 10*3/uL (ref 1.7–7.7)
Neutrophils Relative %: 56 %
Platelets: 178 10*3/uL (ref 150–400)
RBC: 4.79 MIL/uL (ref 4.22–5.81)
RDW: 15.9 % — ABNORMAL HIGH (ref 11.5–15.5)
WBC: 5.2 10*3/uL (ref 4.0–10.5)
nRBC: 0 % (ref 0.0–0.2)

## 2023-02-19 ENCOUNTER — Inpatient Hospital Stay (HOSPITAL_BASED_OUTPATIENT_CLINIC_OR_DEPARTMENT_OTHER): Payer: Medicare Other | Admitting: Oncology

## 2023-02-19 ENCOUNTER — Encounter: Payer: Self-pay | Admitting: Oncology

## 2023-02-19 VITALS — BP 106/69 | HR 85 | Temp 97.7°F | Resp 18 | Wt 212.1 lb

## 2023-02-19 DIAGNOSIS — D751 Secondary polycythemia: Secondary | ICD-10-CM | POA: Diagnosis not present

## 2023-02-19 NOTE — Assessment & Plan Note (Addendum)
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 JAK2 V617F mutation negative, with reflex to other mutations CALR, MPL, JAK 2 Ex 12-15 mutations negative. BCR ABL 1 FISH negative. Labs are reviewed and discussed with patient. Hematocrit is less than 50.  I will hold off phlebotomy.   follow-up in 6 months, lab +/- phlenb follow up in 12 months, lab MD +/- phlebotomy.

## 2023-02-19 NOTE — Progress Notes (Signed)
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 JAK2 V617F mutation negative, with reflex to other mutations CALR, MPL, JAK 2 Ex 12-15 mutations negative. BCR ABL 1 FISH negative. Labs are reviewed and discussed with patient. Hematocrit is less than 50.  I will hold off phlebotomy.   follow-up in 6 months, lab +/- phlenb follow up in 12 months, lab MD +/- phlebotomy.   Orders Placed This Encounter  Procedures   Hemoglobin and Hematocrit (Cancer Center Only)    Standing Status:   Future    Standing Expiration Date:   02/19/2024   CBC with Differential (Cancer Center Only)    Standing Status:   Future    Standing Expiration Date:   02/19/2024    All questions were answered. The patient knows to call the clinic with any problems, questions or concerns. No barriers to learning was detected.  Rickard Patience, MD, PhD [facility] Hematology Oncology 02/19/2023    INTERVAL HISTORY: Matthew Owen 62 y.o. male presents for follow up of secondary erythrocytosis.  He is on testosterone replacement, weekly.  No new complaints.   ALLERGIES:  has No Known Allergies.  MEDICATIONS:  Current Outpatient Medications  Medication Sig Dispense Refill   amLODipine (NORVASC) 10 MG tablet Take 5 mg by mouth daily.     aspirin 81 MG chewable tablet Chew by mouth.     carvedilol (COREG) 6.25 MG tablet Take 6.25 mg by mouth daily.     Coenzyme Q10 (CO Q-10) 200 MG CAPS Take by mouth.     LORazepam (ATIVAN) 2 MG tablet Take 2 mg by mouth every 8 (eight) hours as needed.     losartan (COZAAR) 100 MG tablet      MAGNESIUM PO Take by mouth.     Multiple Vitamin (MULTI-VITAMINS) TABS Take by mouth.     niacin 500 MG tablet Take 1,000 mg by mouth.     Omega-3 Fatty Acids (FISH OIL) 1000 MG CAPS Take 2,500 mg by mouth 3 (three) times daily.     rosuvastatin (CRESTOR)  10 MG tablet Take 1 tablet (10 mg total) by mouth daily. 90 tablet 3   testosterone cypionate (DEPOTESTOSTERONE CYPIONATE) 200 MG/ML injection Inject 50 mg into the muscle. Every 5 days     TURMERIC PO Take 1,000 mg by mouth daily.     valACYclovir (VALTREX) 500 MG tablet as needed. (Patient not taking: Reported on 02/19/2023)     No current facility-administered medications for this visit.     Review of Systems  Constitutional:  Negative for appetite change, chills, fatigue, fever and unexpected weight change.  HENT:   Negative for hearing loss and voice change.   Eyes:  Negative for eye problems and icterus.  Respiratory:  Negative for chest tightness, cough and shortness of breath.   Cardiovascular:  Negative for chest pain and leg swelling.  Gastrointestinal:  Negative for abdominal distention and abdominal pain.  Endocrine: Negative for hot flashes.  Genitourinary:  Negative for difficulty urinating, dysuria and frequency.   Musculoskeletal:  Negative for arthralgias.  Skin:  Negative for itching and rash.  Neurological:  Negative for light-headedness and numbness.  Hematological:  Negative for adenopathy. Does not bruise/bleed easily.  Psychiatric/Behavioral:  Negative for confusion.      PHYSICAL EXAMINATION: ECOG PERFORMANCE STATUS: 0 - Asymptomatic  Vitals:   02/19/23 1450  BP: 106/69  Pulse: 85  Resp: 18  Temp: 97.7 F (36.5 C)   Filed Weights   02/19/23 1450  Weight: 212 lb 1.6 oz (96.2 kg)    Physical Exam Constitutional:      General: He is not in acute distress.    Appearance: He is not diaphoretic.  HENT:     Head: Normocephalic and atraumatic.  Eyes:     General: No scleral icterus.    Pupils: Pupils are equal, round, and reactive to light.  Cardiovascular:     Rate and Rhythm: Normal rate and regular rhythm.     Heart sounds: No murmur heard. Pulmonary:     Effort: Pulmonary effort is normal. No respiratory distress.     Breath sounds: No rales.   Chest:     Chest wall: No tenderness.  Abdominal:     General: There is no distension.     Palpations: Abdomen is soft.     Tenderness: There is no abdominal tenderness.  Musculoskeletal:        General: Normal range of motion.     Cervical back: Normal range of motion and neck supple.  Skin:    General: Skin is warm and dry.     Findings: No erythema.     Comments: tatto  Neurological:     Mental Status: He is alert and oriented to person, place, and time.     Cranial Nerves: No cranial nerve deficit.     Motor: No abnormal muscle tone.     Coordination: Coordination normal.  Psychiatric:        Mood and Affect: Affect normal.      LABORATORY DATA:  I have reviewed the data as listed    Latest Ref Rng & Units 02/14/2023   10:34 AM 08/19/2022    3:43 PM 11/14/2021    1:34 PM  CBC  WBC 4.0 - 10.5 K/uL 5.2  7.3  7.3   Hemoglobin 13.0 - 17.0 g/dL 03.4  74.2  59.5   Hematocrit 39.0 - 52.0 % 46.3  47.3  45.2   Platelets 150 - 400 K/uL 178  205  207       Latest Ref Rng & Units 08/19/2022    3:43 PM 10/14/2018    3:42 PM  CMP  Glucose 70 - 99 mg/dL 98  99   BUN 6 - 20 mg/dL 27  30   Creatinine 6.38 - 1.24 mg/dL 7.56  4.33   Sodium 295 - 145 mmol/L 133  134   Potassium 3.5 - 5.1 mmol/L 3.8  4.1   Chloride 98 - 111 mmol/L 101  104   CO2 22 - 32 mmol/L 25  22   Calcium 8.9 - 10.3 mg/dL 9.1  9.3   Total Protein 6.5 - 8.1 g/dL 7.5  7.8   Total Bilirubin 0.3 - 1.2 mg/dL 0.6  0.8   Alkaline Phos 38 - 126 U/L 68  73   AST 15 - 41 U/L 50  64   ALT 0 - 44 U/L 37  104      RADIOGRAPHIC STUDIES: I have personally reviewed the radiological images as listed and agreed with the findings in the report. No results found.

## 2023-03-18 IMAGING — CT CT CARDIAC CORONARY ARTERY CALCIUM SCORE
3 series · 14 of 20 positions shown, 16 images · non-contrast
Comparison: None.

CLINICAL DATA: Hyperlipidemia

EXAM:
CT CARDIAC CORONARY ARTERY CALCIUM SCORE
TECHNIQUE: Non-contrast imaging through the heart was performed using
prospective ECG gating. Image post processing was performed on an
independent workstation, allowing for quantitative analysis of the
heart and coronary arteries. Note that this exam targets the heart
and the chest was not imaged in its entirety.

[Series 2: calcium scoring 2.00 qr36 bestdiast 57% hrt calciu · axial · 0.41mm/px · z∈[+1590,+1674]mm · 4 of 70 slices shown]
[im 14/70  vessel]
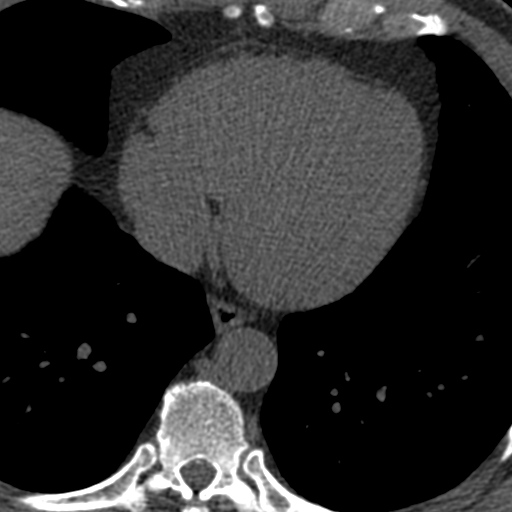
[im 28/70  vessel]
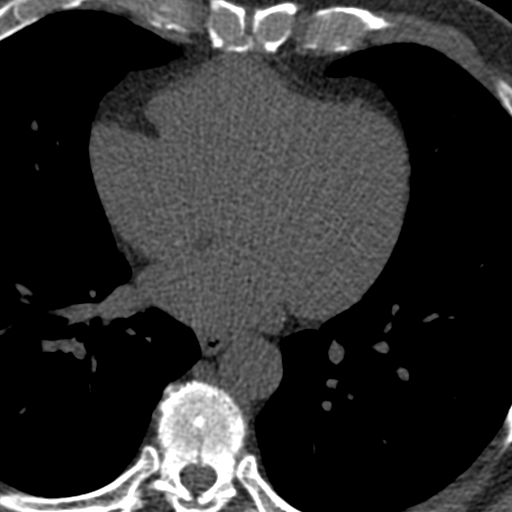
[im 42/70  vessel]
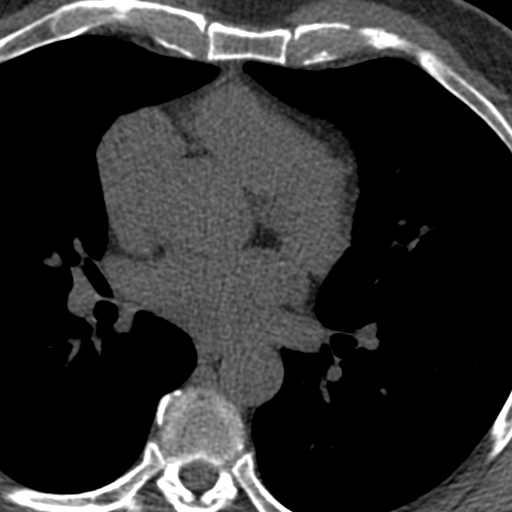
[im 56/70  vessel]
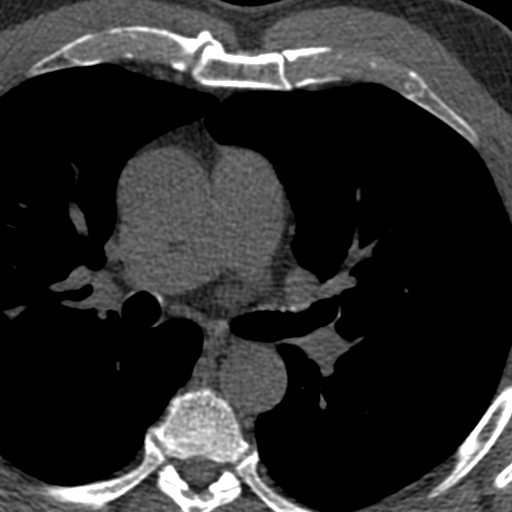

[Series 3: calcium scoring 2.00 br40 bestdiast 57% axial · axial · 0.61mm/px · z∈[+1586,+1678]mm · 5 of 70 slices shown, 7 images]
[im 12/70  vessel]
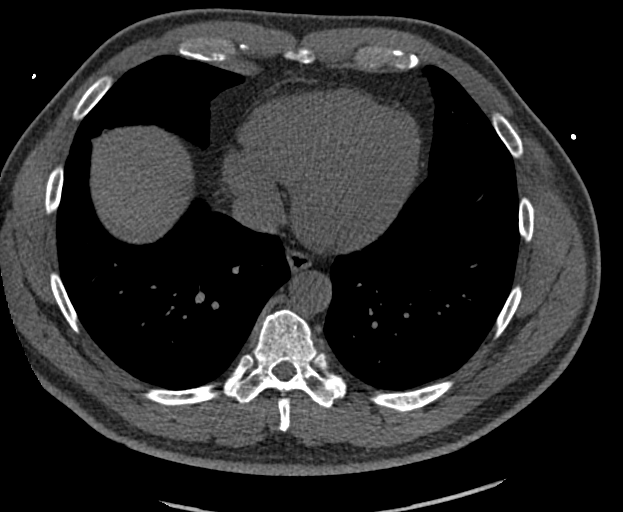
[im 12/70  lung]
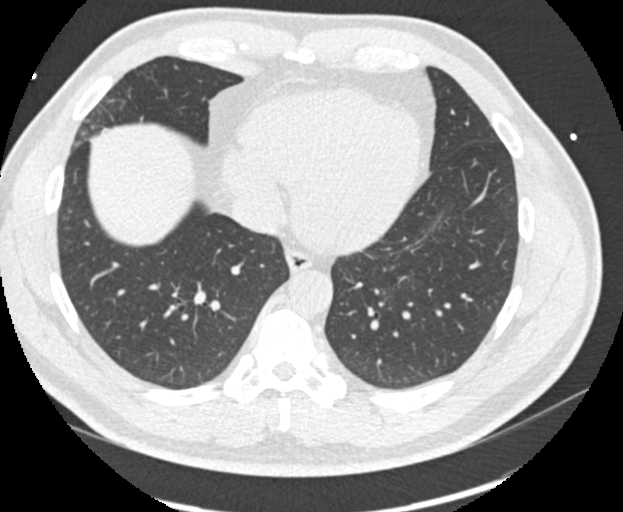
[im 24/70  vessel]
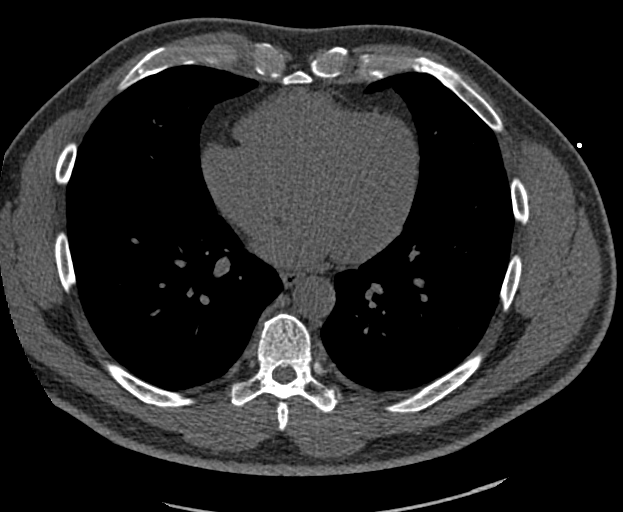
[im 35/70  vessel]
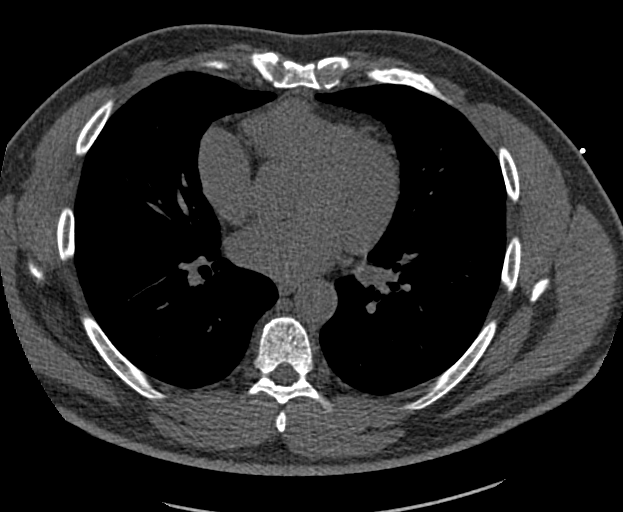
[im 47/70  vessel]
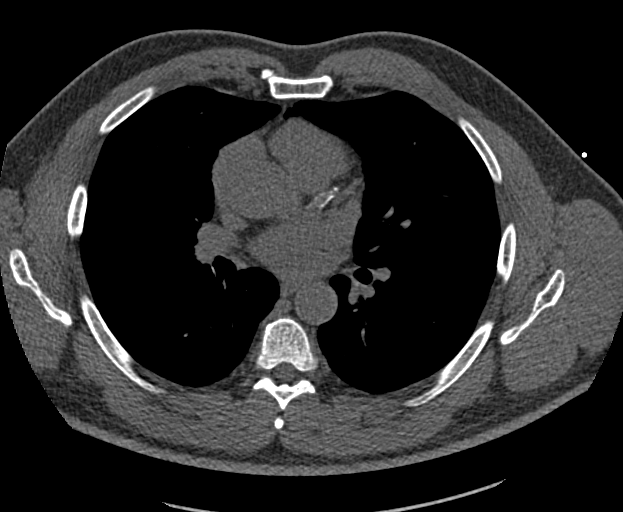
[im 58/70  vessel]
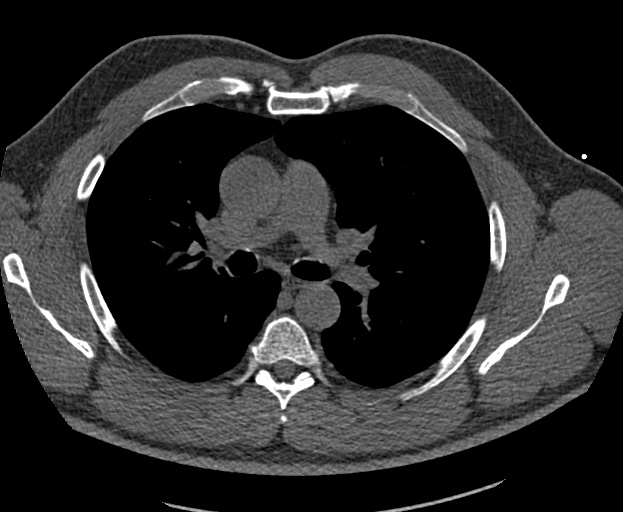
[im 58/70  lung]
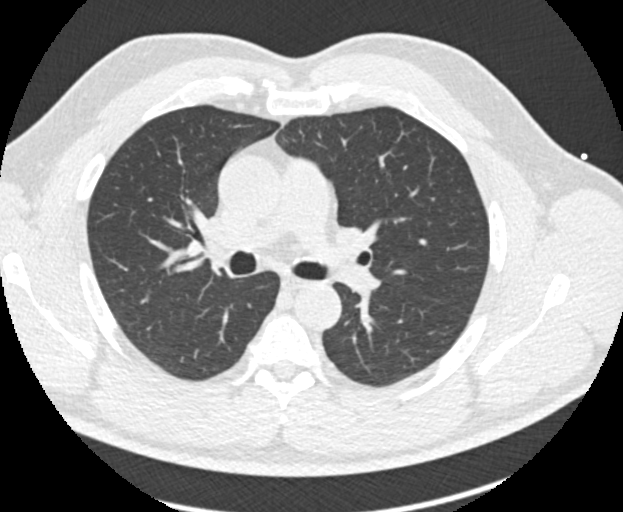

[Series 9: calcium scoring 2.00 br60 bestdiast 57% lungs · axial · 0.61mm/px · z∈[+1586,+1678]mm · 5 of 70 slices shown]
[im 12/70  vessel]
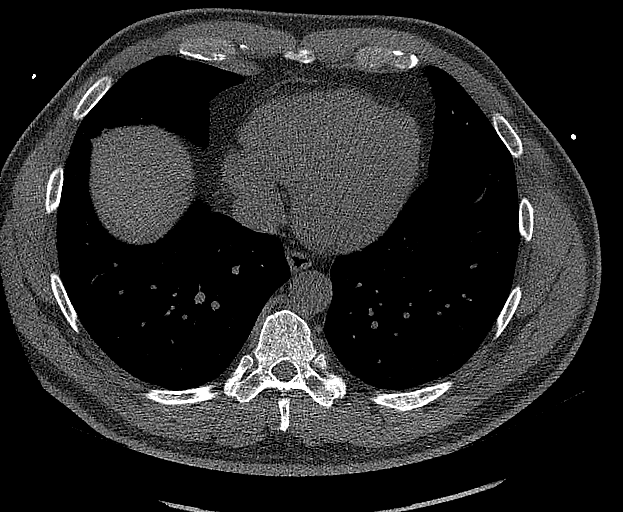
[im 24/70  vessel]
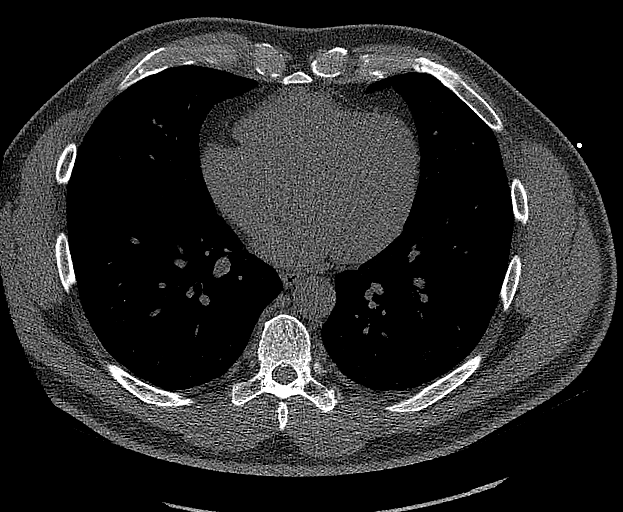
[im 35/70  vessel]
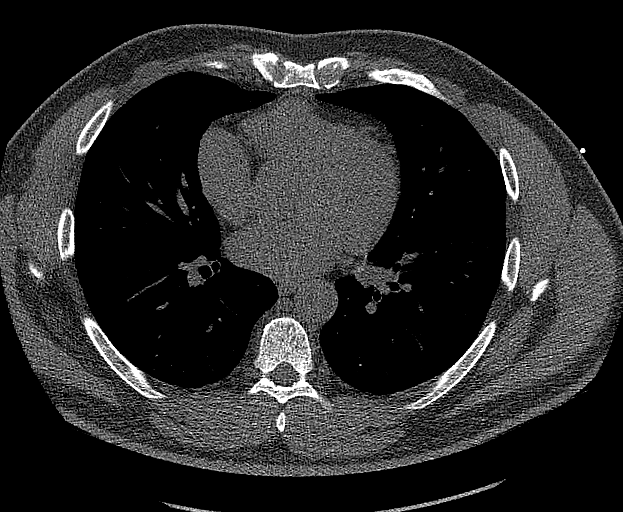
[im 47/70  vessel]
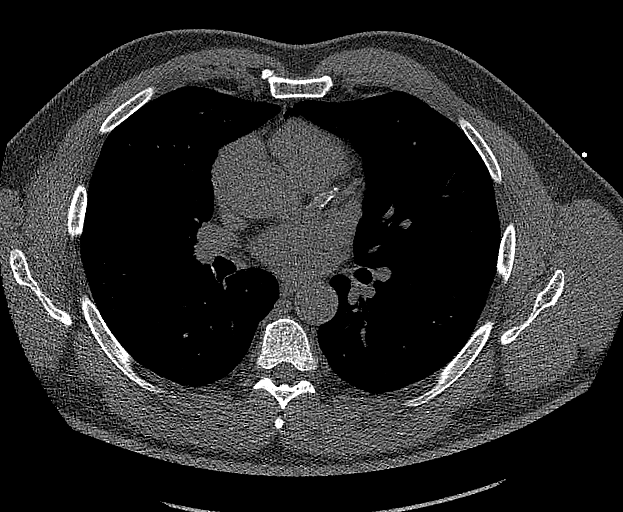
[im 58/70  vessel]
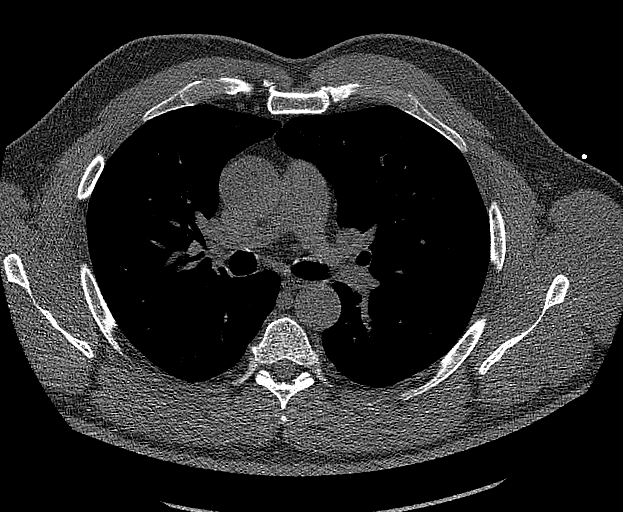

[14 of 20 positions shown; findings below may reference images not displayed]

FINDINGS: CORONARY CALCIUM SCORES:

Left Main: 0

LAD: 577

LCx: 6

RCA: 0

Total Agatston Score: 583

[HOSPITAL] percentile: 95

AORTA MEASUREMENTS:

Ascending Aorta: 37 mm

Descending Aorta: 27 mm

OTHER FINDINGS:

Heart is normal size. Aorta normal caliber. No adenopathy. No
confluent airspace opacities or effusions. Imaging into the upper
abdomen demonstrates no acute findings. Chest wall soft tissues are
unremarkable. No acute bony abnormality.
IMPRESSION: Total Agatston score: 583

[HOSPITAL] percentile: 95

Of

## 2023-05-19 ENCOUNTER — Other Ambulatory Visit: Payer: Self-pay

## 2023-05-19 MED ORDER — ROSUVASTATIN CALCIUM 10 MG PO TABS: 10.0000 mg | ORAL_TABLET | Freq: Every day | ORAL | 0 refills | Status: AC

## 2023-08-19 ENCOUNTER — Inpatient Hospital Stay: Payer: Medicare Other

## 2023-08-19 ENCOUNTER — Inpatient Hospital Stay: Payer: Medicare Other | Attending: Oncology

## 2023-08-19 DIAGNOSIS — D751 Secondary polycythemia: Secondary | ICD-10-CM | POA: Diagnosis present

## 2023-08-19 LAB — HEMOGLOBIN AND HEMATOCRIT (CANCER CENTER ONLY)
HCT: 49.7 % (ref 39.0–52.0)
Hemoglobin: 17.1 g/dL — ABNORMAL HIGH (ref 13.0–17.0)

## 2023-08-19 NOTE — Progress Notes (Signed)
Hct 49.7 Per Dr Cathie Hoops progress notes NO phlebotomy needed today

## 2024-02-17 ENCOUNTER — Encounter: Payer: Self-pay | Admitting: Oncology

## 2024-02-19 ENCOUNTER — Inpatient Hospital Stay: Payer: BC Managed Care – PPO

## 2024-02-19 ENCOUNTER — Telehealth: Payer: Self-pay | Admitting: Oncology

## 2024-02-19 ENCOUNTER — Inpatient Hospital Stay: Payer: BC Managed Care – PPO | Admitting: Oncology

## 2024-02-19 NOTE — Telephone Encounter (Signed)
 Spoke to patient he states he sees PCP and they are watching labs. He will call to r/s if needed

## 2024-08-13 ENCOUNTER — Ambulatory Visit

## 2024-08-16 ENCOUNTER — Ambulatory Visit: Admitting: Family Medicine

## 2024-08-16 ENCOUNTER — Other Ambulatory Visit: Payer: Self-pay

## 2024-08-16 ENCOUNTER — Encounter: Payer: Self-pay | Admitting: Family Medicine

## 2024-08-16 VITALS — BP 132/81 | HR 78 | Temp 98.3°F | Resp 18 | Ht 71.0 in | Wt 213.0 lb

## 2024-08-16 DIAGNOSIS — Z1211 Encounter for screening for malignant neoplasm of colon: Secondary | ICD-10-CM | POA: Insufficient documentation

## 2024-08-16 DIAGNOSIS — I10 Essential (primary) hypertension: Secondary | ICD-10-CM | POA: Diagnosis not present

## 2024-08-16 DIAGNOSIS — E782 Mixed hyperlipidemia: Secondary | ICD-10-CM | POA: Diagnosis not present

## 2024-08-16 DIAGNOSIS — D751 Secondary polycythemia: Secondary | ICD-10-CM | POA: Diagnosis not present

## 2024-08-16 DIAGNOSIS — R7989 Other specified abnormal findings of blood chemistry: Secondary | ICD-10-CM | POA: Diagnosis not present

## 2024-08-16 MED ORDER — TESTOSTERONE CYPIONATE 200 MG/ML IM SOLN: INTRAMUSCULAR | 1 refills | Status: AC

## 2024-08-16 NOTE — Assessment & Plan Note (Signed)
 Reports his LDL does not need to be treated because he does not have small dense LDL particles.  I have not seen this study in his chart.  He did have

## 2024-08-16 NOTE — Addendum Note (Signed)
 Addended by: Magdelene Ruark K on: 08/16/2024 05:23 PM   Modules accepted: Orders

## 2024-08-16 NOTE — Assessment & Plan Note (Signed)
 Had cardiac echo 03/02/2021, left ventricle ejection fraction 60 to 65% left ventricle with normal function no regional wall motion abnormalities. Left ventricular diastolic parameters were consistent with grade 1 diastolic dysfunction. Borderline dilatation of the aortic root, measuring 39 mm.

## 2024-08-16 NOTE — Assessment & Plan Note (Signed)
 Has erythrocytosis from treatment of low testosterone.  Currently taking testosterone 100 mg every 5 days that is 600 mg a month which seems high.

## 2024-08-16 NOTE — Assessment & Plan Note (Signed)
 Likely from anabolic steroid use years ago.  Takes 100 mg every 5 days.  Is due for labs.  Will check CBC, CMP, lipid and testosterone levels.

## 2024-08-16 NOTE — Assessment & Plan Note (Addendum)
 Advised that he does have plaque around his LAD that is seen on his CT coronary artery calcium  score.  Also his 10-year ASCVD risk score is 12.4%.  We like to treat people if they are greater than 7.  He does not want a statin and does not believe he needs one.  Is due for 10 year FOLLOW-UP colonsocpy

## 2024-08-16 NOTE — Progress Notes (Addendum)
 New Patient Office Visit  Subjective    Patient ID: Matthew Owen, male    DOB: April 27, 1961  Age: 63 y.o. MRN: [id number]  CC: No chief complaint on file.   HPI Matthew Owen presents to establish care Discussed the use of AI scribe software for clinical note transcription with the patient, who gave verbal consent to proceed.  History of Present Illness   Matthew Owen is a 63 year old male who presents for a follow-up and to discuss blood work scheduling.  He is scheduled for a colonoscopy next month but has not yet made an appointment. He had a colonoscopy ten years ago which was normal.  He has a history of hypertension and is currently taking amlodipine 10mg , carvedilol 6.25 mg, and losartan 100 mg. He also takes an 81 mg aspirin daily at night. His blood pressure in the office is 132/71 mmHg, although he does not check it frequently at home. He owns a manual blood pressure cuff.  He is on testosterone therapy, taking 100 mg every five days. His hematocrit levels were last recorded at 53.  If his hematocrit remains high then he spaces his testosterone therapy out to 100 mg every 7 to 10 days.  Takes about 6 weeks for his hematocrit to come down.  He gives himself his testosterone shots in the buttocks.  He has a history of using anabolic steroids from 2003 to 2012, which he believes affected his testosterone levels. He follows a low-carb diet, avoiding junk food, sodas, and alcohol, and focuses on whole foods. He uses real butter, extra virgin olive oil, and organic beef tallow for cooking.  He takes niacin 2000 mg daily and omega-3 fish oil 4000 mg. His triglycerides were last recorded at 50. He does not take Crestor  or any statins.  He reports his small dense LDL particle count is low so he does not need a statin.  He occasionally takes lorazepam, about once a year, for anxiety-inducing situations such as funerals. He does not take it regularly and prefers to avoid such  medications.  He uses valacyclovir for cold sores, taking four tablets at the onset of symptoms and another four tablets 12 hours later, although he rarely experiences cold sores since changing his diet.  He has a history of a coronary artery calcium  score test in March 2022, which showed a TOTAL score of 584 in the left anterior descending artery. He attributes this to his past steroid use and poor diet, which he has since improved.  No frequent snoring, waking up trying to breathe, or being tired all the time. Occasionally snores.       Outpatient Encounter Medications as of 08/16/2024  Medication Sig   amLODipine (NORVASC) 10 MG tablet Take 5 mg by mouth daily.   aspirin 81 MG chewable tablet Chew by mouth.   carvedilol (COREG) 6.25 MG tablet Take 6.25 mg by mouth daily.   Coenzyme Q10 (CO Q-10) 200 MG CAPS Take by mouth.   LORazepam (ATIVAN) 2 MG tablet Take 2 mg by mouth every 8 (eight) hours as needed.   losartan (COZAAR) 100 MG tablet    MAGNESIUM PO Take by mouth.   Multiple Vitamin (MULTI-VITAMINS) TABS Take by mouth.   niacin 500 MG tablet Take 1,000 mg by mouth.   Omega-3 Fatty Acids (FISH OIL) 1000 MG CAPS Take 2,500 mg by mouth 3 (three) times daily.   valACYclovir (VALTREX) 500 MG tablet as needed.   [DISCONTINUED] testosterone cypionate (DEPOTESTOSTERONE CYPIONATE) 200  MG/ML injection Inject 50 mg into the muscle. Every 5 days   rosuvastatin  (CRESTOR ) 10 MG tablet Take 1 tablet (10 mg total) by mouth daily.   testosterone (ANDROGEL) 50 MG/5GM (1%) GEL Place 5 g onto the skin daily.   testosterone cypionate (DEPOTESTOSTERONE CYPIONATE) 200 MG/ML injection 100 mg Every 10 days   TURMERIC PO Take 1,000 mg by mouth daily.   No facility-administered encounter medications on file as of 08/16/2024.    Past Medical History:  Diagnosis Date   Erythrocytosis 01/24/2021   Hypertension    Low testosterone     History reviewed. No pertinent surgical history.  Family History   Problem Relation Age of Onset   Diabetes Mother    Congestive Heart Failure Mother    Lung cancer Father    Heart disease Father     Social History   Socioeconomic History   Marital status: Married    Spouse name: Not on file   Number of children: Not on file   Years of education: Not on file   Highest education level: Never attended school  Occupational History   Not on file  Tobacco Use   Smoking status: Never    Passive exposure: Never   Smokeless tobacco: Never  Vaping Use   Vaping status: Never Used  Substance and Sexual Activity   Alcohol use: Not Currently    Comment: occasiol beer   Drug use: Never   Sexual activity: Yes  Other Topics Concern   Not on file  Social History Narrative   Not on file   Social Drivers of Health   Financial Resource Strain: Low Risk  (08/12/2024)   Overall Financial Resource Strain (CARDIA)    Difficulty of Paying Living Expenses: Not hard at all  Food Insecurity: No Food Insecurity (08/12/2024)   Hunger Vital Sign    Worried About Running Out of Food in the Last Year: Never true    Ran Out of Food in the Last Year: Never true  Transportation Needs: No Transportation Needs (08/12/2024)   PRAPARE - Administrator, Civil Service (Medical): No    Lack of Transportation (Non-Medical): No  Physical Activity: Sufficiently Active (08/12/2024)   Exercise Vital Sign    Days of Exercise per Week: 4 days    Minutes of Exercise per Session: 60 min  Stress: No Stress Concern Present (08/12/2024)   [employer] of Occupational Health - Occupational Stress Questionnaire    Feeling of Stress: Not at all  Social Connections: Unknown (08/12/2024)   Social Connection and Isolation Panel    Frequency of Communication with Friends and Family: Patient declined    Frequency of Social Gatherings with Friends and Family: Patient declined    Attends Religious Services: Never    Database administrator or Organizations: No    Attends Probation officer: Not on file    Marital Status: Divorced  Catering manager Violence: Not on file    ROS      Objective   BP 132/81 (BP Location: Left Arm, Patient Position: Sitting, Cuff Size: Normal)   Pulse 78   Temp 98.3 F (36.8 C) (Oral)   Resp 18   Ht 5' 11 (1.803 m)   Wt 213 lb (96.6 kg)   SpO2 98%   BMI 29.71 kg/m    Physical Exam Vitals and nursing note reviewed.  Constitutional:      Appearance: Normal appearance.  HENT:     Head: Normocephalic and  atraumatic.  Eyes:     Conjunctiva/sclera: Conjunctivae normal.  Cardiovascular:     Rate and Rhythm: Normal rate and regular rhythm.  Pulmonary:     Effort: Pulmonary effort is normal.     Breath sounds: Normal breath sounds.  Musculoskeletal:     Right lower leg: No edema.     Left lower leg: No edema.  Skin:    General: Skin is warm and dry.  Neurological:     Mental Status: He is alert and oriented to person, place, and time.  Psychiatric:        Mood and Affect: Mood normal.        Behavior: Behavior normal.        Thought Content: Thought content normal.        Judgment: Judgment normal.             The 10-year ASCVD risk score (Arnett DK, et al., 2019) is: 12.4%     Assessment & Plan:  Decreased testosterone level Assessment & Plan: Likely from anabolic steroid use years ago.  Takes 100 mg every 5 days.  Is due for labs.  Will check CBC, CMP, lipid and testosterone levels.  Orders: -     Testosterone Cypionate; 100 mg Every 10 days  Dispense: 10 mL; Refill: 1 -     CBC with Differential/Platelet; Future -     Comprehensive metabolic panel with GFR; Future -     Testosterone,Free and Total; Future  Primary hypertension -     TSH -     CBC with Differential/Platelet; Future -     Comprehensive metabolic panel with GFR; Future -     Lipid panel; Future -     T4, free; Future  Mixed hyperlipidemia Assessment & Plan: Reports his LDL does not need to be treated because he  does not have small dense LDL particles.  I have not seen this study in his chart.  He did have   Benign essential hypertension Assessment & Plan: Had cardiac echo 03/02/2021, left ventricle ejection fraction 60 to 65% left ventricle with normal function no regional wall motion abnormalities. Left ventricular diastolic parameters were consistent with grade 1 diastolic dysfunction. Borderline dilatation of the aortic root, measuring 39 mm.    Screening for colon cancer Assessment & Plan: Advised that he does have plaque around his LAD that is seen on his CT coronary artery calcium  score.  Also his 10-year ASCVD risk score is 12.4%.  We like to treat people if they are greater than 7.  He does not want a statin and does not believe he needs one.  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  Currently taking testosterone 100 mg every 5 days that is 600 mg a month which seems high.     Return in about 6 months (around 02/14/2025).   Arretta Toenjes K Kenzee Bassin, MD

## 2024-08-20 ENCOUNTER — Ambulatory Visit

## 2024-08-20 ENCOUNTER — Telehealth: Payer: Self-pay

## 2024-08-20 DIAGNOSIS — R7989 Other specified abnormal findings of blood chemistry: Secondary | ICD-10-CM

## 2024-08-20 DIAGNOSIS — Z1211 Encounter for screening for malignant neoplasm of colon: Secondary | ICD-10-CM

## 2024-08-20 DIAGNOSIS — I10 Essential (primary) hypertension: Secondary | ICD-10-CM

## 2024-08-20 NOTE — Telephone Encounter (Signed)
 Call pt back when scope schedule opens for [facility] in January.  Prefers to schedule in January.  Thanks,  Rosaline

## 2024-08-21 LAB — COMPREHENSIVE METABOLIC PANEL WITH GFR
ALT: 53 IU/L — ABNORMAL HIGH (ref 0–44)
AST: 48 IU/L — ABNORMAL HIGH (ref 0–40)
Albumin: 3.9 g/dL (ref 3.9–4.9)
Alkaline Phosphatase: 85 IU/L (ref 47–123)
BUN/Creatinine Ratio: 24 (ref 10–24)
BUN: 24 mg/dL (ref 8–27)
Bilirubin Total: 0.5 mg/dL (ref 0.0–1.2)
CO2: 22 mmol/L (ref 20–29)
Calcium: 8.6 mg/dL (ref 8.6–10.2)
Chloride: 100 mmol/L (ref 96–106)
Creatinine, Ser: 1 mg/dL (ref 0.76–1.27)
Globulin, Total: 2.8 g/dL (ref 1.5–4.5)
Glucose: 100 mg/dL — ABNORMAL HIGH (ref 70–99)
Potassium: 4.4 mmol/L (ref 3.5–5.2)
Sodium: 135 mmol/L (ref 134–144)
Total Protein: 6.7 g/dL (ref 6.0–8.5)
eGFR: 85 mL/min/1.73 (ref >59)

## 2024-08-21 LAB — CBC WITH DIFFERENTIAL/PLATELET
Basophils Absolute: 0 x10E3/uL (ref 0.0–0.2)
Basos: 1 %
EOS (ABSOLUTE): 0.1 x10E3/uL (ref 0.0–0.4)
Eos: 1 %
Hematocrit: 47.6 % (ref 37.5–51.0)
Hemoglobin: 14.8 g/dL (ref 13.0–17.7)
Immature Grans (Abs): 0 x10E3/uL (ref 0.0–0.1)
Immature Granulocytes: 0 %
Lymphocytes Absolute: 1.6 x10E3/uL (ref 0.7–3.1)
Lymphs: 25 %
MCH: 27.9 pg (ref 26.6–33.0)
MCHC: 31.1 g/dL — ABNORMAL LOW (ref 31.5–35.7)
MCV: 90 fL (ref 79–97)
Monocytes Absolute: 1 x10E3/uL — ABNORMAL HIGH (ref 0.1–0.9)
Monocytes: 15 %
Neutrophils Absolute: 3.7 x10E3/uL (ref 1.4–7.0)
Neutrophils: 58 %
Platelets: 239 x10E3/uL (ref 150–450)
RBC: 5.3 x10E6/uL (ref 4.14–5.80)
RDW: 16.6 % — ABNORMAL HIGH (ref 11.6–15.4)
WBC: 6.4 x10E3/uL (ref 3.4–10.8)

## 2024-08-21 LAB — LIPID PANEL
Chol/HDL Ratio: 3.6 ratio (ref 0.0–5.0)
Cholesterol, Total: 164 mg/dL (ref 100–199)
HDL: 46 mg/dL (ref >39)
LDL Chol Calc (NIH): 107 mg/dL — ABNORMAL HIGH (ref 0–99)
Triglycerides: 53 mg/dL (ref 0–149)
VLDL Cholesterol Cal: 11 mg/dL (ref 5–40)

## 2024-08-21 LAB — T4, FREE: Free T4: 1.14 ng/dL (ref 0.82–1.77)

## 2024-08-21 LAB — TESTOSTERONE,FREE AND TOTAL
Testosterone, Free: 18.6 pg/mL — ABNORMAL HIGH (ref 6.6–18.1)
Testosterone: 760 ng/dL (ref 264–916)

## 2024-08-24 ENCOUNTER — Ambulatory Visit: Payer: Self-pay | Admitting: Family Medicine

## 2024-10-11 ENCOUNTER — Other Ambulatory Visit: Payer: Self-pay

## 2024-10-11 DIAGNOSIS — Z1211 Encounter for screening for malignant neoplasm of colon: Secondary | ICD-10-CM

## 2024-10-11 NOTE — Telephone Encounter (Signed)
 Gastroenterology Pre-Procedure Review  Request Date: 01/20/25 Requesting Physician: Dr. Jinny  PATIENT REVIEW QUESTIONS: The patient responded to the following health history questions as indicated:    1. Are you having any GI issues? no 2. Do you have a personal history of Polyps? no 3. Do you have a family history of Colon Cancer or Polyps? no 4. Diabetes Mellitus? no 5. Joint replacements in the past 12 months?no 6. Major health problems in the past 3 months?no 7. Any artificial heart valves, MVP, or defibrillator?no     MEDICATIONS & ALLERGIES:    Patient reports the following regarding taking any anticoagulation/antiplatelet therapy:   Plavix, Coumadin, Eliquis, Xarelto, Lovenox, Pradaxa, Brilinta, or Effient? no Aspirin? yes (81 mg daily)  Patient confirms/reports the following medications:  Current Outpatient Medications  Medication Sig Dispense Refill   amLODipine (NORVASC) 10 MG tablet Take 5 mg by mouth daily.     aspirin 81 MG chewable tablet Chew by mouth.     carvedilol (COREG) 6.25 MG tablet Take 6.25 mg by mouth daily.     Coenzyme Q10 (CO Q-10) 200 MG CAPS Take by mouth.     LORazepam (ATIVAN) 2 MG tablet Take 2 mg by mouth every 8 (eight) hours as needed.     losartan (COZAAR) 100 MG tablet      MAGNESIUM PO Take by mouth.     Multiple Vitamin (MULTI-VITAMINS) TABS Take by mouth.     niacin 500 MG tablet Take 1,000 mg by mouth.     Omega-3 Fatty Acids (FISH OIL) 1000 MG CAPS Take 2,500 mg by mouth 3 (three) times daily.     rosuvastatin  (CRESTOR ) 10 MG tablet Take 1 tablet (10 mg total) by mouth daily. 90 tablet 0   testosterone  (ANDROGEL ) 50 MG/5GM (1%) GEL Place 5 g onto the skin daily.     testosterone  cypionate (DEPOTESTOSTERONE CYPIONATE) 200 MG/ML injection 100 mg Every 10 days 10 mL 1   TURMERIC PO Take 1,000 mg by mouth daily.     valACYclovir (VALTREX) 500 MG tablet as needed.     No current facility-administered medications for this visit.     Patient confirms/reports the following allergies:  No Known Allergies  No orders of the defined types were placed in this encounter.   AUTHORIZATION INFORMATION Primary Insurance: 1D#: Group #:  Secondary Insurance: 1D#: Group #:  SCHEDULE INFORMATION: Date: 01/20/25 Time: Location: [facility]

## 2024-11-01 NOTE — Progress Notes (Signed)
 Follow Up Visit   Patient Name: Matthew Owen, male   Patient DOB: 05/12/1961 Date of Service: 11/01/2024  Patient MRN: [id number] Provider Creating Note: Bonnell Sherry, MD  [phone] Primary Care Physician:   [address] [place] [address] Additional Physicians/ Providers:    History of Present Illness Matthew Owen is a 63 y.o. male who is following up today for abnormal renal function testing, hypertension, erythrocytosis.  The patient's renal function remains normal with most recent eGFR 86.  In regards hypertension blood pressure currently 122/78.  He has history of pure hypercholesterolemia however total cholesterol down to 152 with triglycerides of 61, HDL 43, and LDL of 94.  Medications   Current Outpatient Medications:    amLODIPine (NORVASC) 10 MG tablet, Take 1 tablet (10 mg total) by mouth 1 (one) time each day, Disp: 90 tablet, Rfl: 3   Ascorbic Acid (Vitamin C) 500 MG capsule, Take by mouth 1 (one) time each day 500-1000mg  daily, Disp: , Rfl:    aspirin 81 MG chewable tablet, Chew 1 tablet (81 mg total) 1 (one) time each day, Disp: 30 tablet, Rfl: 11   carvedilol (COREG) 6.25 MG tablet, TAKE 1 TABLET BY MOUTH IN THE  MORNING, Disp: 90 tablet, Rfl: 3   Cholecalciferol (Vitamin D) 125 MCG (5000 UT) capsule, Take by mouth, Disp: , Rfl:    CINNAMON PO, Take by mouth, Disp: , Rfl:    Coenzyme Q10 300 MG capsule, Take 30 mg by mouth 1 (one) time each day, Disp: , Rfl:    losartan (COZAAR) 100 MG tablet, Take 1/2 tablet PO daily, Disp: 90 tablet, Rfl: 3   milk thistle 175 MG tablet, Take 175 mg by mouth 1 (one) time each day, Disp: , Rfl:    multivitamin (THERAGRAN) tablet, Take by mouth, Disp: , Rfl:    niacin 500 MG tablet, Take 1,000 mg by mouth    , Disp: , Rfl:    omega-3 (FISH OIL) 1000 MG capsule, Take by mouth, Disp: , Rfl:    rosuvastatin  (CRESTOR ) 10 MG tablet, , Disp: , Rfl:    SM MAGNESIUM CITRATE PO, Take by mouth 420 mg at night, Disp: , Rfl:     Testosterone  Cypionate 200 MG/ML solution, Inject 100 mg into the shoulder, thigh, or buttocks, Disp: , Rfl:    Turmeric (QC TUMERIC COMPLEX PO), Take by mouth, Disp: , Rfl:    valACYclovir (VALTREX) 500 MG tablet, as needed., Disp: , Rfl:    Allergies Patient has no known allergies.  Problem List Patient Active Problem List  Diagnosis   Abnormal renal function   Benign essential hypertension   Testosterone  level below reference range     Review of Systems  Constitutional:  Negative for chills and fever.  Respiratory:  Negative for cough and shortness of breath.   Cardiovascular:  Negative for chest pain and palpitations.  Gastrointestinal:  Negative for nausea and vomiting.  Genitourinary:  Negative for dysuria, hematuria and urgency.     History Past Medical History:  Diagnosis Date   Abnormal renal function 08/13/2019   Benign essential hypertension 08/13/2019   Decreased testosterone  level 08/13/2019   Erythrocytosis    Other and unspecified hyperlipidemia     History reviewed. No pertinent surgical history. Family History  Problem Relation Age of Onset   Cancer Father        Lung   Heart disease Father    Diabetes Mother    Social History  Tobacco Use   Smoking status: Never   Smokeless tobacco: Not on file  Substance Use Topics   Alcohol use: Yes    Comment: Alcoholic Drinks/day: Occasional social drink        Physical Exam  Vitals BP 122/78 (BP Location: Right upper arm, Patient Position: Sitting)   Pulse 76   Temp 97.3 F   Wt 208 lb (94.3 kg)   SpO2 95%   BMI 29.01 kg/m   PHYSICAL EXAM: General appearance: well developed, well nourished, muscular male, NAD Eyes: anicteric sclerae, moist conjunctivae; no lid-lag  HENT: Atraumatic; hearing intact Neck: Trachea midline; supple Lungs: CTAB, with normal respiratory effort  CV: S1S2, no murmurs or rubs. Abdomen: Soft, non-tender; bowel sounds present Extremities: No  peripheral edema Skin: Warm and dry, tattoos on both upper extremities. Psych: Appropriate affect, alert and oriented to person, place and time    Laboratory Studies  Chemistry  Lab Units 10/21/24 0847 04/15/24 1053 10/16/23 0956 04/24/23 0919 01/08/23 0924  SODIUM mmol/L 135 135 136 135 136  POTASSIUM mmol/L 4.0 4.6 4.5 4.3 4.4  CHLORIDE mmol/L 101 99 102 103 104  CO2 mmol/L 23 25 26 23 23   CALCIUM  mg/dL 8.7 9.5 9.4 9.5 9.6  PHOSPHORUS mg/dL 2.6 2.9 3.4 2.9 3.6  PTH pg/mL 17 12*  --   --   --   GLUCOSE mg/dL 892* 90 98 899* 890*  ALBUMIN g/dL 3.9 4.2 4.3 4.1 4.2  BUN mg/dL 20 26* 24 22 32*  CREATININE mg/dL 9.00 8.87 8.93 8.96 8.93  HEMOGLOBIN A1C % 5.6  --  5.4 5.3  --         No lab exists for component: IRON SATURATION, TRANSSATPER  CBC  Lab Units 10/21/24 0847 06/09/24 0905 04/15/24 1053 10/16/23 0956 01/08/23 0924  WBC AUTO Thousand/uL 5.5 7.3 8.0 6.1 5.4  HEMOGLOBIN g/dL 84.4 83.4 83.7 84.3 83.4  HEMATOCRIT % 46.9 53.5* 50.3* 47.7 48.6  MCV fL 89.8 96.4 101.2* 97.7 94.9  PLATELETS AUTO Thousand/uL 208 262 245 195 156    Urine  Lab Units 04/15/24 1053 10/16/23 0956 04/24/23 0919  PROT/CREAT RATIO UR mg/g creat 0.063  63  --   --   ALB MG/G CREAT UR mg/g creat  --  NOTE NOTE     Lab Results  Component Value Date   PTH 17 10/21/2024   CALCIUM  8.7 10/21/2024   PHOS 2.6 10/21/2024     Imaging and Other Studies     No orders of the defined types were placed in this encounter.       Impression/Recommendations  Matthew Owen is a 63 y.o. male with past medical history hypertension, hyperlipidemia, history of low testosterone  level was a self-referral for evaluation management of elevated creatinine level.  1. Abnormal renal function testing, manifesting as high creatinine due to high muscle mass but normal creatinine clearance. 2. Hypertension. 3. Low testosterone  levels, on supplementation 4. Hyperlipidemia. 5. Erythyrocytosis  likely secondary to testosterone  use. 6.  Coronary disease with elevated calcium  score on CT scan  Plan: Patient appears to be doing quite well at the moment.  eGFR currently up to 86.  Regards hypertension blood pressure currently 122/78.  Patient to be maintained on amlodipine, carvedilol, and losartan for hypertension control.  He also has history of pure hypercholesterolemia with improved lipid profile with total cholesterol 152, triglycerides 61, HDL 43, and LDL 94.  Finally also has low testosterone  and testosterone  level came back at 667 now.  Erythrocytosis  appears to be resolved at the moment.  Will continue to monitor lab testing.  Return in about 6 months (around 05/02/2025).   Munsoor Lateef, MD

## 2024-12-10 ENCOUNTER — Ambulatory Visit

## 2024-12-10 VITALS — BP 132/88 | Ht 71.0 in | Wt 208.6 lb

## 2024-12-10 DIAGNOSIS — Z0000 Encounter for general adult medical examination without abnormal findings: Secondary | ICD-10-CM

## 2024-12-10 NOTE — Patient Instructions (Addendum)
 Matthew Owen,  Thank you for taking the time for your Medicare Wellness Visit. I appreciate your continued commitment to your health goals. Please review the care plan we discussed, and feel free to reach out if I can assist you further.  Please note that Annual Wellness Visits do not include a physical exam. Some assessments may be limited, especially if the visit was conducted virtually. If needed, we may recommend an in-person follow-up with your provider.  Ongoing Care Seeing your primary care provider every 3 to 6 months helps us  monitor your health and provide consistent, personalized care. 02/14/25 @ 1:30 pm APPT W/ [email or web address]  Referrals If a referral was made during today's visit and you haven't received any updates within two weeks, please contact the referred provider directly to check on the status.  Recommended Screenings:  Health Maintenance  Topic Date Due   Medicare Annual Wellness Visit  Never done   HIV Screening  Never done   Hepatitis C Screening  Never done   DTaP/Tdap/Td vaccine (1 - Tdap) Never done   Colon Cancer Screening  Never done   Pneumococcal Vaccine for age over 54 (1 of 1 - PCV) Never done   Zoster (Shingles) Vaccine (1 of 2) Never done   Flu Shot  Never done   COVID-19 Vaccine (1 - 2025-26 season) Never done   HPV Vaccine (No Doses Required) Completed   Hepatitis B Vaccine  Aged Out   Meningitis B Vaccine  Aged Out     Vision: Annual vision screenings are recommended for early detection of glaucoma, cataracts, and diabetic retinopathy. These exams can also reveal signs of chronic conditions such as diabetes and high blood pressure.  Dental: Annual dental screenings help detect early signs of oral cancer, gum disease, and other conditions linked to overall health, including heart disease and diabetes.  Please see the attached documents for additional preventive care recommendations.   NEXT AWV 01/06/26 @ 9:20 AM IN PERSON

## 2024-12-10 NOTE — Progress Notes (Signed)
 "  Chief Complaint  Patient presents with   Medicare Wellness     Subjective:   Matthew Owen is a 64 y.o. male who presents for a Medicare Annual Wellness Visit.  Visit info / Clinical Intake: Medicare Wellness Visit Type:: Subsequent Annual Wellness Visit Persons participating in visit and providing information:: patient Medicare Wellness Visit Mode:: In-person (required for WTM) Interpreter Needed?: No Pre-visit prep was completed: yes AWV questionnaire completed by patient prior to visit?: yes Date:: 12/08/24 Living arrangements:: (!) lives alone Patient's Overall Health Status Rating: very good Typical amount of pain: some (shoulder pain) Does pain affect daily life?: no Are you currently prescribed opioids?: no  Dietary Habits and Nutritional Risks How many meals a day?: 2 Eats fruit and vegetables daily?: (!) no Most meals are obtained by: preparing own meals In the last 2 weeks, have you had any of the following?: none Diabetic:: no  Functional Status Activities of Daily Living (to include ambulation/medication): Independent Ambulation: Independent Medication Administration: Independent Home Management (perform basic housework or laundry): Independent Manage your own finances?: yes Primary transportation is: driving Concerns about vision?: no *vision screening is required for WTM* (readers- NO MD) Concerns about hearing?: no  Fall Screening Falls in the past year?: 0 Number of falls in past year: 0 Was there an injury with Fall?: 0 Fall Risk Category Calculator: 0 Patient Fall Risk Level: Low Fall Risk  Fall Risk Patient at Risk for Falls Due to: No Fall Risks Fall risk Follow up: Falls evaluation completed; Falls prevention discussed  Home and Transportation Safety: All rugs have non-skid backing?: N/A, no rugs All stairs or steps have railings?: N/A, no stairs Grab bars in the bathtub or shower?: yes Have non-skid surface in bathtub or shower?:  yes Good home lighting?: yes Regular seat belt use?: yes Hospital stays in the last year:: no  Cognitive Assessment Difficulty concentrating, remembering, or making decisions? : no Will 6CIT or Mini Cog be Completed: yes What year is it?: 0 points What month is it?: 0 points Give patient an address phrase to remember (5 components): [address] [place], [place] About what time is it?: 0 points Count backwards from 20 to 1: 0 points Say the months of the year in reverse: 0 points Repeat the address phrase from earlier: 0 points 6 CIT Score: 0 points  Advance Directives (For Healthcare) Does Patient Have a Medical Advance Directive?: No Would patient like information on creating a medical advance directive?: No - Patient declined  Reviewed/Updated  Reviewed/Updated: Reviewed All (Medical, Surgical, Family, Medications, Allergies, Care Teams, Patient Goals)    Allergies (verified) Patient has no known allergies.   Current Medications (verified) Outpatient Encounter Medications as of 12/10/2024  Medication Sig   aspirin 81 MG chewable tablet Chew by mouth.   carvedilol (COREG) 6.25 MG tablet Take 6.25 mg by mouth daily.   Coenzyme Q10 (CO Q-10) 200 MG CAPS Take by mouth.   LORazepam (ATIVAN) 2 MG tablet Take 2 mg by mouth every 8 (eight) hours as needed.   losartan (COZAAR) 100 MG tablet    MAGNESIUM PO Take by mouth.   Multiple Vitamin (MULTI-VITAMINS) TABS Take by mouth.   niacin 500 MG tablet Take 1,000 mg by mouth.   Omega-3 Fatty Acids (FISH OIL) 1000 MG CAPS Take 2,500 mg by mouth 3 (three) times daily.   testosterone  cypionate (DEPOTESTOSTERONE CYPIONATE) 200 MG/ML injection 100 mg Every 10 days   TURMERIC PO Take 1,000 mg by mouth daily.  valACYclovir (VALTREX) 500 MG tablet as needed.   amLODipine (NORVASC) 10 MG tablet Take 5 mg by mouth daily. (Patient not taking: Reported on 12/10/2024)   rosuvastatin  (CRESTOR ) 10 MG tablet Take 1 tablet (10 mg total) by mouth  daily.   testosterone  (ANDROGEL ) 50 MG/5GM (1%) GEL Place 5 g onto the skin daily.   No facility-administered encounter medications on file as of 12/10/2024.    History: Past Medical History:  Diagnosis Date   Erythrocytosis 01/24/2021   Hypertension    Low testosterone     History reviewed. No pertinent surgical history. Family History  Problem Relation Age of Onset   Diabetes Mother    Congestive Heart Failure Mother    Lung cancer Father    Heart disease Father    Social History   Occupational History   Not on file  Tobacco Use   Smoking status: Never    Passive exposure: Never   Smokeless tobacco: Never  Vaping Use   Vaping status: Never Used  Substance and Sexual Activity   Alcohol use: Not Currently    Comment: occasiol beer   Drug use: Never   Sexual activity: Yes   Tobacco Counseling Counseling given: Not Answered  SDOH Screenings   Food Insecurity: No Food Insecurity (12/10/2024)  Housing: Unknown (12/10/2024)  Transportation Needs: No Transportation Needs (12/10/2024)  Utilities: Not At Risk (12/10/2024)  Depression (PHQ2-9): Low Risk (12/10/2024)  Financial Resource Strain: Low Risk (12/08/2024)  Physical Activity: Sufficiently Active (12/10/2024)  Social Connections: Unknown (12/10/2024)  Stress: No Stress Concern Present (12/10/2024)  Tobacco Use: Low Risk (12/10/2024)  Health Literacy: Adequate Health Literacy (12/10/2024)   See flowsheets for full screening details  Depression Screen PHQ 2 & 9 Depression Scale- Over the past 2 weeks, how often have you been bothered by any of the following problems? Little interest or pleasure in doing things: 0 Feeling down, depressed, or hopeless (PHQ Adolescent also includes...irritable): 0 PHQ-2 Total Score: 0 Trouble falling or staying asleep, or sleeping too much: 0 Feeling tired or having little energy: 0 Poor appetite or overeating (PHQ Adolescent also includes...weight loss): 0 Feeling bad about yourself - or  that you are a failure or have let yourself or your family down: 0 Trouble concentrating on things, such as reading the newspaper or watching television (PHQ Adolescent also includes...like school work): 0 Moving or speaking so slowly that other people could have noticed. Or the opposite - being so fidgety or restless that you have been moving around a lot more than usual: 0 Thoughts that you would be better off dead, or of hurting yourself in some way: 0 PHQ-9 Total Score: 0 If you checked off any problems, how difficult have these problems made it for you to do your work, take care of things at home, or get along with other people?: Not difficult at all  Depression Treatment Depression Interventions/Treatment : EYV7-0 Score <4 Follow-up Not Indicated     Goals Addressed             This Visit's Progress    DIET - EAT MORE FRUITS AND VEGETABLES               Objective:    Today's Vitals   12/10/24 0945  BP: 132/88  Weight: 208 lb 9.6 oz (94.6 kg)  Height: 5' 11 (1.803 m)   Body mass index is 29.09 kg/m.  Hearing/Vision screen No results found. Immunizations and Health Maintenance Health Maintenance  Topic Date Due  Medicare Annual Wellness (AWV)  Never done   HIV Screening  Never done   Hepatitis C Screening  Never done   DTaP/Tdap/Td (1 - Tdap) Never done   Colonoscopy  Never done   Pneumococcal Vaccine: 50+ Years (1 of 1 - PCV) Never done   Zoster Vaccines- Shingrix (1 of 2) Never done   Influenza Vaccine  Never done   COVID-19 Vaccine (1 - 2025-26 season) Never done   HPV VACCINES (No Doses Required) Completed   Hepatitis B Vaccines 19-59 Average Risk  Aged Out   Meningococcal B Vaccine  Aged Out        Assessment/Plan:  This is a routine wellness examination for Matthew Owen.  Patient Care Team: Ziglar, Susan K, MD as PCP - General (Family Medicine) Babara Call, MD as Consulting Physician (Oncology)  I have personally reviewed and noted the following in the  patients chart:   Medical and social history Use of alcohol, tobacco or illicit drugs  Current medications and supplements including opioid prescriptions. Functional ability and status Nutritional status Physical activity Advanced directives List of other physicians Hospitalizations, surgeries, and ER visits in previous 12 months Vitals Screenings to include cognitive, depression, and falls Referrals and appointments  No orders of the defined types were placed in this encounter.  In addition, I have reviewed and discussed with patient certain preventive protocols, quality metrics, and best practice recommendations. A written personalized care plan for preventive services as well as general preventive health recommendations were provided to patient.   Matthew GORMAN Das, LPN   [id number]/[id number]   Return in about 1 year (around 12/10/2025).  After Visit Summary: (MyChart) Due to this being a telephonic visit, the after visit summary with patients personalized plan was offered to patient via MyChart   Nurse Notes: DECLINES VACCINES; HAS APPT FOR COLONOSCOPY IN MARCH  No voiced or noted concerns at this time  "

## 2024-12-13 ENCOUNTER — Encounter: Payer: Self-pay | Admitting: Oncology

## 2025-01-20 ENCOUNTER — Ambulatory Visit: Admit: 2025-01-20 | Admitting: Gastroenterology

## 2025-01-20 SURGERY — COLONOSCOPY
Anesthesia: General

## 2025-02-14 ENCOUNTER — Ambulatory Visit: Admitting: Family Medicine

## 2026-01-06 ENCOUNTER — Ambulatory Visit
